# Patient Record
Sex: Female | Born: 1945 | Race: White | Hispanic: Refuse to answer | Marital: Married | Smoking: Former smoker
Health system: Southern US, Community
[De-identification: ages and names within clinical notes are randomized; demographics above are authoritative.]

## PROBLEM LIST (undated history)

## (undated) DIAGNOSIS — J984 Other disorders of lung: Secondary | ICD-10-CM

## (undated) DIAGNOSIS — I422 Other hypertrophic cardiomyopathy: Secondary | ICD-10-CM

## (undated) DIAGNOSIS — E785 Hyperlipidemia, unspecified: Secondary | ICD-10-CM

## (undated) DIAGNOSIS — I10 Essential (primary) hypertension: Secondary | ICD-10-CM

## (undated) DIAGNOSIS — R0609 Other forms of dyspnea: Secondary | ICD-10-CM

## (undated) DIAGNOSIS — I517 Cardiomegaly: Secondary | ICD-10-CM

## (undated) HISTORY — DX: Cardiomegaly: I51.7

## (undated) HISTORY — DX: Essential (primary) hypertension: I10

## (undated) HISTORY — DX: Hyperlipidemia, unspecified: E78.5

## (undated) HISTORY — DX: Other disorders of lung: J98.4

## (undated) HISTORY — DX: Other hypertrophic cardiomyopathy: I42.2

## (undated) HISTORY — DX: Other forms of dyspnea: R06.09

---

## 2000-05-13 HISTORY — PX: CARDIAC CATHETERIZATION: SHX172

## 2011-02-19 DIAGNOSIS — R0600 Dyspnea, unspecified: Secondary | ICD-10-CM

## 2011-02-19 DIAGNOSIS — R0609 Other forms of dyspnea: Secondary | ICD-10-CM

## 2011-02-19 HISTORY — DX: Dyspnea, unspecified: R06.00

## 2011-02-19 HISTORY — DX: Other forms of dyspnea: R06.09

## 2012-12-25 ENCOUNTER — Encounter: Payer: Self-pay | Admitting: Cardiovascular Disease

## 2012-12-28 ENCOUNTER — Ambulatory Visit (INDEPENDENT_AMBULATORY_CARE_PROVIDER_SITE_OTHER): Payer: 59 | Admitting: Cardiovascular Disease

## 2012-12-28 ENCOUNTER — Encounter: Payer: Self-pay | Admitting: Cardiovascular Disease

## 2012-12-28 VITALS — BP 130/80 | HR 66 | Ht 65.0 in | Wt 167.0 lb

## 2012-12-28 DIAGNOSIS — E785 Hyperlipidemia, unspecified: Secondary | ICD-10-CM

## 2012-12-28 DIAGNOSIS — I10 Essential (primary) hypertension: Secondary | ICD-10-CM

## 2012-12-28 DIAGNOSIS — R0609 Other forms of dyspnea: Secondary | ICD-10-CM | POA: Insufficient documentation

## 2012-12-28 DIAGNOSIS — Z87891 Personal history of nicotine dependence: Secondary | ICD-10-CM | POA: Insufficient documentation

## 2012-12-28 NOTE — Progress Notes (Signed)
12/28/2012 Yolanda Smith   [birth year]  [id number]  Primary Physician Leanor Rubenstein, MD Primary Cardiologist: Runell Gess MD Roseanne Reno   HPI:  The patient is a 67 year old, mildly overweight, married Caucasian female, mother of 2, grandmother to 3 grandchildren whose husband is also a patient of mine. She was self-referred for a second opinion because recent cardiovascular evaluation performed revealed an abnormal 2D echocardiogram.   Risk factors include 50-pack-year history of tobacco abuse, having quit 8 months ago. She has treated hypertension and dyslipidemia. There is no family history. She has never had a heart attack or stroke. She does complain of dyspnea but denies chest pain. She was apparently catheterized 10 years ago in [facility] and was told she had "normal coronary arteries." Echo performed at [facility] suggested right ventricular hypertrophy with pulmonary hypertension and moderate to severe left ventricular hypertrophy. A 2D echo repeated here showed normal LV size and function with mild asymmetric left ventricular hypertrophy, her RV was normal in size and function. 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  Since I saw her back 18 months ago she denies chest pain but does get occasional dyspnea on exertion when walking up stairs.     Current Outpatient Prescriptions  Medication Sig Dispense Refill  . aspirin 81 MG tablet Take 81 mg by mouth daily.      Marland Kitchen CITALOPRAM HYDROBROMIDE PO Take 40 mg by mouth daily.       Marland Kitchen estradiol (ESTRING) 2 MG vaginal ring Place 2 mg vaginally daily. follow package directions      . MEDROXYPROGESTERONE ACETATE PO Take 25 mg by mouth. Take one a day for 12 days every 3 months      . metoCLOPramide (REGLAN) 5 MG tablet       . nicotine polacrilex (NICORETTE) 2 MG lozenge Place 2 mg inside cheek as needed for smoking cessation.      . pravastatin (PRAVACHOL) 20 MG tablet  Take 20 mg by mouth daily.      . promethazine (PHENERGAN) 12.5 MG tablet       . SUPREP BOWEL PREP SOLN       . traZODone (DESYREL) 150 MG tablet Take 150 mg by mouth at bedtime. Take 1/2 tablet at bedtime      . verapamil (VERELAN PM) 240 MG 24 hr capsule Take 240 mg by mouth at bedtime.       No current facility-administered medications for this visit.    Allergies  Allergen Reactions  . Codeine     History   Social History  . Marital Status: Married    Spouse Name: N/A    Number of Children: 2  . Years of Education: N/A   Occupational History  . Office    Social History Main Topics  . Smoking status: Former Smoker -- 50 years    Quit date: 08/31/2010  . Smokeless tobacco: Not on file  . Alcohol Use: Yes     Comment: 6-10 drinks (beer or wine) a year  . Drug Use: No  . Sexual Activity: Not on file   Other Topics Concern  . Not on file   Social History Narrative  . No narrative on file     Review of Systems: General: negative for chills, fever, night sweats or weight changes.  Cardiovascular: negative for chest pain, dyspnea on exertion, edema, orthopnea, palpitations, paroxysmal nocturnal dyspnea or shortness of breath Dermatological: negative for rash Respiratory: negative  for cough or wheezing Urologic: negative for hematuria Abdominal: negative for nausea, vomiting, diarrhea, bright red blood per rectum, melena, or hematemesis Neurologic: negative for visual changes, syncope, or dizziness All other systems reviewed and are otherwise negative except as noted above.    Blood pressure 130/80, pulse 66, height 5\' 5"  (1.651 m), weight 167 lb (75.751 kg).  General appearance: alert and no distress Neck: no adenopathy, no carotid bruit, no JVD, supple, symmetrical, trachea midline and thyroid not enlarged, symmetric, no tenderness/mass/nodules Lungs: clear to auscultation bilaterally Heart: regular rate and rhythm, S1, S2 normal, no murmur, click, rub or  gallop Extremities: extremities normal, atraumatic, no cyanosis or edema  EKG normal sinus rhythm at 66 without ST or T wave changes  ASSESSMENT AND PLAN:   Essential hypertension Well-controlled on current medications  Hyperlipidemia On statin therapy followed by her primary care physician  Dyspnea on exertion Long history of tobacco abuse having stopped 2 years ago. She has an echo that shows normal LV systolic function with proximal septal thickening and mild to moderate TR. She has a history of normal coronary arteries by cath 12 years ago in [place]. I did do a MET test on her which I'll review      Runell Gess MD [facility], [place] 12/28/2012 11:33 AM

## 2012-12-28 NOTE — Assessment & Plan Note (Signed)
On statin therapy followed by her primary care physician 

## 2012-12-28 NOTE — Patient Instructions (Addendum)
Your physician wants you to follow-up in: 1 year with Dr Berry. You will receive a reminder letter in the mail two months in advance. If you don't receive a letter, please call our office to schedule the follow-up appointment.  

## 2012-12-28 NOTE — Assessment & Plan Note (Signed)
Long history of tobacco abuse having stopped 2 years ago. She has an echo that shows normal LV systolic function with proximal septal thickening and mild to moderate TR. She has a history of normal coronary arteries by cath 12 years ago in [place]. I did do a MET test on her which I'll review

## 2012-12-28 NOTE — Assessment & Plan Note (Signed)
Well-controlled on current medications 

## 2013-01-04 ENCOUNTER — Telehealth: Payer: Self-pay | Admitting: Cardiovascular Disease

## 2013-01-04 NOTE — Telephone Encounter (Signed)
I discussed with Ms. Yolanda Smith that her an MET test was mildly abnormal though her VO2was acceptable.I told her that I would see her back at our routine schedule time, sooner if she developed chest pain or increasing shortness of breath. She verbalized understanding.

## 2013-04-26 ENCOUNTER — Telehealth: Payer: Self-pay | Admitting: Cardiovascular Disease

## 2013-04-26 ENCOUNTER — Encounter (INDEPENDENT_AMBULATORY_CARE_PROVIDER_SITE_OTHER): Payer: Self-pay

## 2013-04-26 ENCOUNTER — Encounter: Payer: Self-pay | Admitting: *Deleted

## 2013-04-26 NOTE — Telephone Encounter (Signed)
Need  A new code for my chart,she was instructed to call herr doctor office.

## 2013-04-26 NOTE — Telephone Encounter (Signed)
Returned call and pt verified x 2.  Pt informed message received and letter w/ new code will be mailed.  Pt verbalized understanding and agreed w/ plan.

## 2013-04-26 NOTE — Telephone Encounter (Signed)
Wrong pt

## 2014-03-07 ENCOUNTER — Encounter: Payer: Self-pay | Admitting: Cardiovascular Disease

## 2014-03-07 ENCOUNTER — Ambulatory Visit (INDEPENDENT_AMBULATORY_CARE_PROVIDER_SITE_OTHER): Payer: 59 | Admitting: Cardiovascular Disease

## 2014-03-07 VITALS — BP 138/82 | HR 68 | Ht 64.5 in | Wt 171.0 lb

## 2014-03-07 DIAGNOSIS — I10 Essential (primary) hypertension: Secondary | ICD-10-CM

## 2014-03-07 DIAGNOSIS — E785 Hyperlipidemia, unspecified: Secondary | ICD-10-CM

## 2014-03-07 DIAGNOSIS — R0609 Other forms of dyspnea: Secondary | ICD-10-CM

## 2014-03-07 NOTE — Assessment & Plan Note (Signed)
On statin therapy. Her most recent lipid profile performed 09/29/13 revealed a total cholesterol of 186, LDL of 103 and HDL of 70

## 2014-03-07 NOTE — Assessment & Plan Note (Signed)
Controlled on current medications 

## 2014-03-07 NOTE — Patient Instructions (Signed)
Your physician wants you to follow-up in: 1 year with Dr Berry. You will receive a reminder letter in the mail two months in advance. If you don't receive a letter, please call our office to schedule the follow-up appointment.  

## 2014-03-07 NOTE — Assessment & Plan Note (Signed)
Attributed to her COPD with 50 pack years of tobacco abuse having quit 3 years ago

## 2014-03-07 NOTE — Progress Notes (Signed)
03/07/2014 Yolanda Smith   10/16/1945  [id number]  Primary Physician Leanor RubensteinSUN,VYVYAN Y, MD Primary Cardiologist: Runell GessJonathan J. Berry MD Roseanne RenoFACP,FACC,FAHA, FSCAI   HPI:  The patient is a 68 year old, mildly overweight, married Caucasian female, mother of 2, grandmother to 3 grandchildren whose husband is also a patient of mine. She was self-referred for a second opinion because recent cardiovascular evaluation performed revealed an abnormal 2D echocardiogram. I saw her one year ago  Risk factors include 50-pack-year history of tobacco abuse, having quit in 2012 She has treated hypertension and dyslipidemia. There is no family history. She has never had a heart attack or stroke. She does complain of dyspnea but denies chest pain. She was apparently catheter[facility] ago [facility] and was told she had "normal coronary arteries." Echo performed at [facility] suggested right ventricular hypertrophy with pulmonary hypertension and moderate to severe left ventricular hypertrophy. A 2D echo repeated here showed normal LV size and function with mild asymmetric left ventricular hypertrophy, her RV was normal in size and function. 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  Since I saw her back 14 months ago she denies chest pain but does get occasional dyspnea on exertion when walking up stairs.    Current Outpatient Prescriptions  Medication Sig Dispense Refill  . aspirin 81 MG tablet Take 81 mg by mouth daily.      Marland Kitchen. CITALOPRAM HYDROBROMIDE PO Take 40 mg by mouth daily.       Marland Kitchen. estradiol (ESTRING) 2 MG vaginal ring Place 2 mg vaginally daily. follow package directions      . MEDROXYPROGESTERONE ACETATE PO Take 25 mg by mouth. Take one a day for 12 days every 3 months      . nicotine polacrilex (NICORETTE) 2 MG lozenge Place 2 mg inside cheek as needed for smoking cessation.      . pravastatin (PRAVACHOL) 20 MG tablet Take 20 mg by mouth daily.       . traZODone (DESYREL) 150 MG tablet Take 150 mg by mouth at bedtime. Take 1/2 tablet at bedtime      . verapamil (VERELAN PM) 240 MG 24 hr capsule Take 240 mg by mouth at bedtime.       No current facility-administered medications for this visit.    Allergies  Allergen Reactions  . Codeine     History   Social History  . Marital Status: Married    Spouse Name: N/A    Number of Children: 2  . Years of Education: N/A   Occupational History  . Office    Social History Main Topics  . Smoking status: Former Smoker -- 50 years    Quit date: 08/31/2010  . Smokeless tobacco: Not on file  . Alcohol Use: Yes     Comment: 6-10 drinks (beer or wine) a year  . Drug Use: No  . Sexual Activity: Not on file   Other Topics Concern  . Not on file   Social History Narrative  . No narrative on file     Review of Systems: General: negative for chills, fever, night sweats or weight changes.  Cardiovascular: negative for chest pain, dyspnea on exertion, edema, orthopnea, palpitations, paroxysmal nocturnal dyspnea or shortness of breath Dermatological: negative for rash Respiratory: negative for cough or wheezing Urologic: negative for hematuria Abdominal: negative for nausea, vomiting, diarrhea, bright red blood per rectum, melena, or hematemesis Neurologic: negative for visual changes, syncope, or dizziness  All other systems reviewed and are otherwise negative except as noted above.    Blood pressure 138/82, pulse 68, height 5' 4.5" (1.638 m), weight 171 lb (77.565 kg).  General appearance: alert and no distress Neck: no adenopathy, no carotid bruit, no JVD, supple, symmetrical, trachea midline and thyroid not enlarged, symmetric, no tenderness/mass/nodules Lungs: clear to auscultation bilaterally Heart: regular rate and rhythm, S1, S2 normal, no murmur, click, rub or gallop Extremities: extremities normal, atraumatic, no cyanosis or edema  EKG sinus rhythm at 68 without ST  or T-wave changes  ASSESSMENT AND PLAN:   Essential hypertension Controlled on current medications  Hyperlipidemia On statin therapy. Her most recent lipid profile performed 09/29/13 revealed a total cholesterol of 186, LDL of 103 and HDL of 70  Dyspnea on exertion Attributed to her COPD with 50 pack years of tobacco abuse having quit 3 years ago      Runell GessJonathan J. Berry MD [facility] CenterFACP,FACC,FAHA, [facility] ServicesFSCAI 03/07/2014 5:13 PM

## 2014-11-08 ENCOUNTER — Encounter: Payer: Self-pay | Admitting: *Deleted

## 2019-06-24 ENCOUNTER — Encounter: Payer: Self-pay | Admitting: Nurse Practitioner

## 2019-07-01 ENCOUNTER — Ambulatory Visit: Payer: Self-pay | Admitting: Nurse Practitioner

## 2021-02-28 ENCOUNTER — Telehealth: Payer: Self-pay

## 2021-02-28 NOTE — Telephone Encounter (Signed)
NOTES SCANNED TO REFERRAL 

## 2021-04-10 ENCOUNTER — Encounter: Payer: Self-pay | Admitting: Cardiology

## 2021-04-10 ENCOUNTER — Ambulatory Visit (INDEPENDENT_AMBULATORY_CARE_PROVIDER_SITE_OTHER): Payer: 59 | Admitting: Cardiology

## 2021-04-10 ENCOUNTER — Other Ambulatory Visit: Payer: Self-pay

## 2021-04-10 VITALS — BP 148/78 | HR 60 | Ht 61.0 in | Wt 153.0 lb

## 2021-04-10 DIAGNOSIS — I480 Paroxysmal atrial fibrillation: Secondary | ICD-10-CM | POA: Diagnosis not present

## 2021-04-10 NOTE — Progress Notes (Signed)
Electrophysiology Office Note   Date:  04/10/2021   ID:  Yolanda Smith, DOB 01/12/1946, MRN [id number]  PCP:  Deatra James, MD  Cardiologist:  Elise Benne Primary Electrophysiologist:  Caprice Wasko Jorja Loa, MD    Chief Complaint: AF   History of Present Illness: Yolanda Smith is a 75 y.o. female who is being seen today for the evaluation of AF at the request of Coralee Rud, PA-C. Presenting today for electrophysiology evaluation.  She has a history significant for hypertension, hyperlipidemia, LVH with asymmetric septal hypertrophy.  She also has a history of atrial fibrillation.  She was initially on verapamil, though this was stopped due to junctional bradycardia.  She wore a recent cardiac monitor that showed episodes of atrial fibrillation.  She feels tired and fatigued when she is in atrial fibrillation.  She checks her pulse and notes that her heart rates are both fast and slow.  She would like to do something to stay in normal rhythm.  Today, she denies symptoms of palpitations, chest pain, shortness of breath, orthopnea, PND, lower extremity edema, claudication, dizziness, presyncope, syncope, bleeding, or neurologic sequela. The patient is tolerating medications without difficulties.    Past Medical History:  Diagnosis Date   Asymmetric septal hypertrophy (HCC)    Dyspnea on exertion 02/19/11   Transthoracic echocardiogram EF = >55%. Mild proximal septal thickening noted. LV systolic function is normal. Mild to moderate tricuspid regurgitation. RV systolic pressure is elevated at 32 mmHg. Aortic valve is mildly sclerotic. Mild aortic root dilatation.   Hyperlipidemia    Hypertension    Lung disease    mild   LVH (left ventricular hypertrophy)    Past Surgical History:  Procedure Laterality Date   CARDIAC CATHETERIZATION  2002   (From Dr. Hazle Coca office note 05/01/11) Pt. catheterized 10 years ago in [facility] and was told she had "normal coronary arteries"     Current  Outpatient Medications  Medication Sig Dispense Refill   apixaban (ELIQUIS) 5 MG TABS tablet Take 1 tablet by mouth daily.     CITALOPRAM HYDROBROMIDE PO Take 40 mg by mouth daily.      losartan (COZAAR) 50 MG tablet Take 50 mg by mouth daily.     nicotine polacrilex (COMMIT) 2 MG lozenge Place 2 mg inside cheek as needed for smoking cessation.     pravastatin (PRAVACHOL) 20 MG tablet Take 20 mg by mouth daily.     traZODone (DESYREL) 150 MG tablet Take 150 mg by mouth at bedtime. Take 1/2 tablet at bedtime     No current facility-administered medications for this visit.    Allergies:   Codeine   Social History:  The patient  reports that she quit smoking about 10 years ago. Her smoking use included cigarettes. She does not have any smokeless tobacco history on file. She reports current alcohol use. She reports that she does not use drugs.   Family History:  The patient's family history includes Breast cancer in her mother; Cancer in her paternal grandmother; Hodgkin's lymphoma in her father.    ROS:  Please see the history of present illness.   Otherwise, review of systems is positive for none.   All other systems are reviewed and negative.    PHYSICAL EXAM: VS:  BP (!) 148/78   Pulse 60   Ht 5\' 1"  (1.549 m)   Wt 153 lb (69.4 kg)   SpO2 94%   BMI 28.91 kg/m  , BMI Body mass index is 28.91 kg/m.  GEN: Well nourished, well developed, in no acute distress  HEENT: normal  Neck: no JVD, carotid bruits, or masses Cardiac: RRR; no murmurs, rubs, or gallops,no edema  Respiratory:  clear to auscultation bilaterally, normal work of breathing GI: soft, nontender, nondistended, + BS MS: no deformity or atrophy  Skin: warm and dry Neuro:  Strength and sensation are intact Psych: euthymic mood, full affect  EKG:  EKG is ordered today. Personal review of the ekg ordered shows sinus rhythm, rate 60  Recent Labs: No results found for requested labs within last 8760 hours.    Lipid  Panel  No results found for: CHOL, TRIG, HDL, CHOLHDL, VLDL, LDLCALC, LDLDIRECT   Wt Readings from Last 3 Encounters:  04/10/21 153 lb (69.4 kg)  03/07/14 171 lb (77.6 kg)  12/28/12 167 lb (75.8 kg)      Other studies Reviewed: Additional studies/ records that were reviewed today include: TTE 02/12/2021 Review of the above records today demonstrates:  Ejection fraction 60 to 65% LV size and wall thickness normal Mildly dilated left atrium Right ventricle normal in size and function Right atrium mildly enlarged Aortic valve structurally normal without stenosis or regurgitation normal-appearing mitral valve with normal valve function  ASSESSMENT AND PLAN:  1.  Paroxysmal atrial fibrillation: She would benefit from a rhythm control strategy.  She is currently on Eliquis 5 mg twice daily.  CHA2DS2-VASc of 4.  I discussed with her further options including ablation versus medication management with either dofetilide or Multaq.  She would like to discuss this further with her husband.  2.  Hypertension: Blood pressure elevated today.  We Dorann Davidson allow her primary physician to make further adjustments to her medications.  Case discussed with referring provider  Current medicines are reviewed at length with the patient today.   The patient does not have concerns regarding her medicines.  The following changes were made today:  none  Labs/ tests ordered today include:  Orders Placed This Encounter  Procedures   EKG 12-Lead      Disposition:   FU with Chevonne Bostrom 3 months  Signed, Donovan Gatchel Jorja Loa, MD  04/10/2021 10:51 AM     [facility] [address] [place] [address] [phone] (office) [phone] (fax)

## 2021-04-10 NOTE — Patient Instructions (Signed)
Medication Instructions:  Your physician recommends that you continue on your current medications as directed. Please refer to the Current Medication list given to you today.  *If you need a refill on your cardiac medications before your next appointment, please call your pharmacy*   Lab Work: None ordered If you have labs (blood work) drawn today and your tests are completely normal, you will receive your results only by: MyChart Message (if you have MyChart) OR A paper copy in the mail If you have any lab test that is abnormal or we need to change your treatment, we will call you to review the results.   Testing/Procedures: None ordered   Follow-Up: At [facility], you and your health needs are our priority.  As part of our continuing mission to provide you with exceptional heart care, we have created designated Provider Care Teams.  These Care Teams include your primary Cardiologist (physician) and Advanced Practice Providers (APPs -  Physician Assistants and Nurse Practitioners) who all work together to provide you with the care you need, when you need it.  We recommend signing up for the patient portal called "MyChart".  Sign up information is provided on this After Visit Summary.  MyChart is used to connect with patients for Virtual Visits (Telemedicine).  Patients are able to view lab/test results, encounter notes, upcoming appointments, etc.  Non-urgent messages can be sent to your provider as well.   To learn more about what you can do with MyChart, go to [email or web address].    Your next appointment:   3 month(s)  The format for your next appointment:   In Person  Provider:   Loman Brooklyn, MD    Thank you for choosing [facility]!!   Dory Horn, RN [phone]   Other Instructions  Cardiac Ablation Cardiac ablation is a procedure to destroy (ablate) some heart tissue that is sending bad signals. These bad signals cause problems in heart  rhythm. The heart has many areas that make these signals. If there are problems in these areas, they can make the heart beat in a way that is not normal. Destroying some tissues can help make the heart rhythm normal. Tell your doctor about: Any allergies you have. All medicines you are taking. These include vitamins, herbs, eye drops, creams, and over-the-counter medicines. Any problems you or family members have had with medicines that make you fall asleep (anesthetics). Any blood disorders you have. Any surgeries you have had. Any medical conditions you have, such as kidney failure. Whether you are pregnant or may be pregnant. What are the risks? This is a safe procedure. But problems may occur, including: Infection. Bruising and bleeding. Bleeding into the chest. Stroke or blood clots. Damage to nearby areas of your body. Allergies to medicines or dyes. The need for a pacemaker if the normal system is damaged. Failure of the procedure to treat the problem. What happens before the procedure? Medicines Ask your doctor about: Changing or stopping your normal medicines. This is important. Taking aspirin and ibuprofen. Do not take these medicines unless your doctor tells you to take them. Taking other medicines, vitamins, herbs, and supplements. General instructions Follow instructions from your doctor about what you cannot eat or drink. Plan to have someone take you home from the hospital or clinic. If you will be going home right after the procedure, plan to have someone with you for 24 hours. Ask your doctor what steps will be taken to prevent infection. What happens during the procedure?  An IV tube will be put into one of your veins. You will be given a medicine to help you relax. The skin on your neck or groin will be numbed. A cut (incision) will be made in your neck or groin. A needle will be put through your cut and into a large vein. A tube (catheter) will be put into the  needle. The tube will be moved to your heart. Dye may be put through the tube. This helps your doctor see your heart. Small devices (electrodes) on the tube will send out signals. A type of energy will be used to destroy some heart tissue. The tube will be taken out. Pressure will be held on your cut. This helps stop bleeding. A bandage will be put over your cut. The exact procedure may vary among doctors and hospitals. What happens after the procedure? You will be watched until you leave the hospital or clinic. This includes checking your heart rate, breathing rate, oxygen, and blood pressure. Your cut will be watched for bleeding. You will need to lie still for a few hours. Do not drive for 24 hours or as long as your doctor tells you. Summary Cardiac ablation is a procedure to destroy some heart tissue. This is done to treat heart rhythm problems. Tell your doctor about any medical conditions you may have. Tell him or her about all medicines you are taking to treat them. This is a safe procedure. But problems may occur. These include infection, bruising, bleeding, and damage to nearby areas of your body. Follow what your doctor tells you about food and drink. You may also be told to change or stop some of your medicines. After the procedure, do not drive for 24 hours or as long as your doctor tells you. This information is not intended to replace advice given to you by your health care provider. Make sure you discuss any questions you have with your health care provider. Document Revised: 04/01/2019 Document Reviewed: 04/01/2019 Elsevier Patient Education  2022 [employer].

## 2021-07-27 ENCOUNTER — Encounter: Payer: Self-pay | Admitting: *Deleted

## 2021-07-27 ENCOUNTER — Encounter: Payer: Self-pay | Admitting: Cardiology

## 2021-07-27 ENCOUNTER — Ambulatory Visit (INDEPENDENT_AMBULATORY_CARE_PROVIDER_SITE_OTHER): Payer: 59 | Admitting: Cardiology

## 2021-07-27 ENCOUNTER — Other Ambulatory Visit: Payer: Self-pay

## 2021-07-27 VITALS — BP 150/90 | HR 74 | Ht 64.0 in | Wt 153.0 lb

## 2021-07-27 DIAGNOSIS — Z01812 Encounter for preprocedural laboratory examination: Secondary | ICD-10-CM

## 2021-07-27 DIAGNOSIS — I480 Paroxysmal atrial fibrillation: Secondary | ICD-10-CM

## 2021-07-27 MED ORDER — METOPROLOL TARTRATE 100 MG PO TABS: ORAL_TABLET | ORAL | 0 refills | Status: DC

## 2021-07-27 NOTE — Progress Notes (Signed)
? ?Electrophysiology Office Note ? ? ?Date:  07/27/2021  ? ?ID:  Yolanda Smith Patient, DOB May 23, 1945, MRN [id number] ? ?PCP:  Yolanda Prose, MD  ?Cardiologist:  Yolanda Smith ?Primary Electrophysiologist:  Yolanda Legere Meredith Leeds, MD   ? ?Chief Complaint: AF ?  ?History of Present Illness: ?Yolanda Smith is a 76 y.o. female who is being seen today for the evaluation of AF at the request of Yolanda Prose, MD. Presenting today for electrophysiology evaluation. ? ?She has a history significant for hypertension, hyperlipidemia, LVH with asymmetric septal hypertrophy.  She also has a history of atrial fibrillation.  She was initially on verapamil but this was stopped due to junctional bradycardia.  She has fatigue and shortness of breath associated with her atrial fibrillation.  She would like a rhythm control strategy. ? ?Today, denies symptoms of palpitations, chest pain, shortness of breath, orthopnea, PND, lower extremity edema, claudication, dizziness, presyncope, syncope, bleeding, or neurologic sequela. The patient is tolerating medications without difficulties.  Her atrial fibrillation was well controlled over the last few months, but has started to increase more. ? ? ?Past Medical History:  ?Diagnosis Date  ? Asymmetric septal hypertrophy (HCC)   ? Dyspnea on exertion 02/19/11  ? Transthoracic echocardiogram EF = >55%. Mild proximal septal thickening noted. LV systolic function is normal. Mild to moderate tricuspid regurgitation. RV systolic pressure is elevated at 32 mmHg. Aortic valve is mildly sclerotic. Mild aortic root dilatation.  ? Hyperlipidemia   ? Hypertension   ? Lung disease   ? mild  ? LVH (left ventricular hypertrophy)   ? ?Past Surgical History:  ?Procedure Laterality Date  ? CARDIAC CATHETERIZATION  2002  ? (From Dr. Kennon Smith office note 05/01/11) Pt. catheterized 10 years ago in [facility] and was told she had "normal coronary arteries"  ? ? ? ?Current Outpatient Medications  ?Medication Sig Dispense Refill  ? apixaban  (ELIQUIS) 5 MG TABS tablet Take 1 tablet by mouth 2 (two) times daily.    ? CITALOPRAM HYDROBROMIDE PO Take 40 mg by mouth daily.     ? losartan (COZAAR) 50 MG tablet Take 50 mg by mouth daily.    ? metoprolol tartrate (LOPRESSOR) 100 MG tablet Take 1 tablet (100 mg total) 2 hours prior to CT scan 1 tablet 0  ? nicotine polacrilex (COMMIT) 2 MG lozenge Place 2 mg inside cheek as needed for smoking cessation.    ? pravastatin (PRAVACHOL) 20 MG tablet Take 20 mg by mouth daily.    ? traMADol-acetaminophen (ULTRACET) 37.5-325 MG tablet Take 1 tablet by mouth 3 (three) times daily.    ? traZODone (DESYREL) 150 MG tablet Take 150 mg by mouth at bedtime.    ? ?No current facility-administered medications for this visit.  ? ? ?Allergies:   Codeine  ? ?Social History:  The patient  reports that she quit smoking about 10 years ago. Her smoking use included cigarettes. She does not have any smokeless tobacco history on file. She reports current alcohol use. She reports that she does not use drugs.  ? ?Family History:  The patient's family history includes Breast cancer in her mother; Cancer in her paternal grandmother; Hodgkin's lymphoma in her father.  ? ?ROS:  Please see the history of present illness.   Otherwise, review of systems is positive for none.   All other systems are reviewed and negative.  ? ?PHYSICAL EXAM: ?VS:  BP (!) 150/90   Pulse 74   Ht 5\' 4"  (1.626 m)   Wt 153 lb (  69.4 kg)   SpO2 96%   BMI 26.26 kg/m?  , BMI Body mass index is 26.26 kg/m?. ?GEN: Well nourished, well developed, in no acute distress  ?HEENT: normal  ?Neck: no JVD, carotid bruits, or masses ?Cardiac: RRR; no murmurs, rubs, or gallops,no edema  ?Respiratory:  clear to auscultation bilaterally, normal work of breathing ?GI: soft, nontender, nondistended, + BS ?MS: no deformity or atrophy  ?Skin: warm and dry ?Neuro:  Strength and sensation are intact ?Psych: euthymic mood, full affect ? ?EKG:  EKG is ordered today. ?Personal review of the  ekg ordered shows ectopic atrial rhythm, rate 74 ? ?Recent Labs: ?No results found for requested labs within last 8760 hours.  ? ? ?Lipid Panel  ?No results found for: CHOL, TRIG, HDL, CHOLHDL, VLDL, LDLCALC, LDLDIRECT ? ? ?Wt Readings from Last 3 Encounters:  ?07/27/21 153 lb (69.4 kg)  ?04/10/21 153 lb (69.4 kg)  ?03/07/14 171 lb (77.6 kg)  ?  ? ? ?Other studies Reviewed: ?Additional studies/ records that were reviewed today include: TTE 02/12/2021 ?Review of the above records today demonstrates:  ?Ejection fraction 60 to 65% ?LV size and wall thickness normal ?Mildly dilated left atrium ?Right ventricle normal in size and function ?Right atrium mildly enlarged ?Aortic valve structurally normal without stenosis or regurgitation normal-appearing mitral valve with normal valve function ? ?ASSESSMENT AND PLAN: ? ?1.  Paroxysmal atrial fibrillation: Currently on Eliquis 5 mg twice daily.  CHA2DS2-VASc of 4.  She would benefit from a rhythm control strategy.  She would prefer to avoid medications such as dofetilide or Multaq.  Due to that, we Leylanie Woodmansee plan for ablation. ? ?Risk, benefits, and alternatives to EP study and radiofrequency ablation for afib were also discussed in detail today. These risks include but are not limited to stroke, bleeding, vascular damage, tamponade, perforation, damage to the esophagus, lungs, and other structures, pulmonary vein stenosis, worsening renal function, and death. The patient understands these risk and wishes to proceed.  We Labria Wos therefore proceed with catheter ablation at the next available time.  Carto, ICE, anesthesia are requested for the procedure.  Kyler Germer also obtain CT PV protocol prior to the procedure to exclude LAA thrombus and further evaluate atrial anatomy.  ? ?1.  Paroxysmal atrial fibrillation: She would benefit from a rhythm control strategy.  She is currently on Eliquis 5 mg twice daily.  CHA2DS2-VASc of 4.  I discussed with her further options including ablation versus  medication management with either dofetilide or Multaq.  She would like to discuss this further with her husband. ? ?2.  Hypertension: Blood pressure elevated today.  We Walterine Amodei allow her primary physician to make further adjustments. ? ?Secondary hypercoagulable state: Currently on Eliquis for atrial fibrillation as above ? ?Current medicines are reviewed at length with the patient today.   ?The patient does not have concerns regarding her medicines.  The following changes were made today:  none ? ?Labs/ tests ordered today include:  ?Orders Placed This Encounter  ?Procedures  ? CT CARDIAC MORPH/PULM VEIN W/CM&W/O CA SCORE  ? Basic metabolic panel  ? CBC  ? EKG 12-Lead  ? ? ? ? ?Disposition:   FU with Taron Conrey 3 months ? ?Signed, ?Calloway Andrus Meredith Leeds, MD  ?07/27/2021 4:39 PM    ? ?[facility] ?[address] ?[address] ?[place] [address] ?[phone] (office) ?[phone] (fax) ? ?

## 2021-07-27 NOTE — Patient Instructions (Addendum)
Medication Instructions:  ?Your physician recommends that you continue on your current medications as directed. Please refer to the Current Medication list given to you today. ? ?*If you need a refill on your cardiac medications before your next appointment, please call your pharmacy* ? ? ?Lab Work: ?Pre procedure labs 09/17/2021:  BMP & CBC ? ?If you have labs (blood work) drawn today and your tests are completely normal, you will receive your results only by: ?MyChart Message (if you have MyChart) OR ?A paper copy in the mail ?If you have any lab test that is abnormal or we need to change your treatment, we will call you to review the results. ? ? ?Testing/Procedures: ?Your physician has requested that you have cardiac CT within 7 days PRIOR to your ablation. Cardiac computed tomography (CT) is a painless test that uses an x-ray machine to take clear, detailed pictures of your heart.  Please follow instruction sheet given to you today. ?You will get a call from our office to schedule the date for this test. ? ?Your physician has recommended that you have an ablation. Catheter ablation is a medical procedure used to treat some cardiac arrhythmias (irregular heartbeats). During catheter ablation, a long, thin, flexible tube is put into a blood vessel in your groin (upper thigh), or neck. This tube is called an ablation catheter. It is then guided to your heart through the blood vessel. Radio frequency waves destroy small areas of heart tissue where abnormal heartbeats may cause an arrhythmia to start. Please follow instruction sheet given to you today. ? ? ?Follow-Up: ?At [facility], you and your health needs are our priority.  As part of our continuing mission to provide you with exceptional heart care, we have created designated Provider Care Teams.  These Care Teams include your primary Cardiologist (physician) and Advanced Practice Providers (APPs -  Physician Assistants and Nurse Practitioners) who all work  together to provide you with the care you need, when you need it. ? ?Your next appointment:   ?1 month(s) after your ablation ? ?The format for your next appointment:   ?In Person ? ?Provider:   ?AFib clinic ? ? ?Thank you for choosing [facility]!! ? ? ?Dory Horn, RN ?[phone] ? ? ? ?Other Instructions ?                          ?Cardiac Ablation ?Cardiac ablation is a procedure to destroy (ablate) some heart tissue that is sending bad signals. These bad signals cause problems in heart rhythm. ?The heart has many areas that make these signals. If there are problems in these areas, they can make the heart beat in a way that is not normal. Destroying some tissues can help make the heart rhythm normal. ?Tell your doctor about: ?Any allergies you have. ?All medicines you are taking. These include vitamins, herbs, eye drops, creams, and over-the-counter medicines. ?Any problems you or family members have had with medicines that make you fall asleep (anesthetics). ?Any blood disorders you have. ?Any surgeries you have had. ?Any medical conditions you have, such as kidney failure. ?Whether you are pregnant or may be pregnant. ?What are the risks? ?This is a safe procedure. But problems may occur, including: ?Infection. ?Bruising and bleeding. ?Bleeding into the chest. ?Stroke or blood clots. ?Damage to nearby areas of your body. ?Allergies to medicines or dyes. ?The need for a pacemaker if the normal system is damaged. ?Failure of the procedure to  treat the problem. ?What happens before the procedure? ?Medicines ?Ask your doctor about: ?Changing or stopping your normal medicines. This is important. ?Taking aspirin and ibuprofen. Do not take these medicines unless your doctor tells you to take them. ?Taking other medicines, vitamins, herbs, and supplements. ?General instructions ?Follow instructions from your doctor about what you cannot eat or drink. ?Plan to have someone take you home from the hospital or  clinic. ?If you will be going home right after the procedure, plan to have someone with you for 24 hours. ?Ask your doctor what steps will be taken to prevent infection. ?What happens during the procedure? ? ?An IV tube will be put into one of your veins. ?You will be given a medicine to help you relax. ?The skin on your neck or groin will be numbed. ?A cut (incision) will be made in your neck or groin. A needle will be put through your cut and into a large vein. ?A tube (catheter) will be put into the needle. The tube will be moved to your heart. ?Dye may be put through the tube. This helps your doctor see your heart. ?Small devices (electrodes) on the tube will send out signals. ?A type of energy will be used to destroy some heart tissue. ?The tube will be taken out. ?Pressure will be held on your cut. This helps stop bleeding. ?A bandage will be put over your cut. ?The exact procedure may vary among doctors and hospitals. ?What happens after the procedure? ?You will be watched until you leave the hospital or clinic. This includes checking your heart rate, breathing rate, oxygen, and blood pressure. ?Your cut will be watched for bleeding. You will need to lie still for a few hours. ?Do not drive for 24 hours or as long as your doctor tells you. ?Summary ?Cardiac ablation is a procedure to destroy some heart tissue. This is done to treat heart rhythm problems. ?Tell your doctor about any medical conditions you may have. Tell him or her about all medicines you are taking to treat them. ?This is a safe procedure. But problems may occur. These include infection, bruising, bleeding, and damage to nearby areas of your body. ?Follow what your doctor tells you about food and drink. You may also be told to change or stop some of your medicines. ?After the procedure, do not drive for 24 hours or as long as your doctor tells you. ?This information is not intended to replace advice given to you by your health care provider.  Make sure you discuss any questions you have with your health care provider. ?Document Revised: 04/01/2019 Document Reviewed: 04/01/2019 ?Elsevier Patient Education ? 2022 Elsevier Inc[employer] ? ? ?

## 2021-09-17 ENCOUNTER — Other Ambulatory Visit: Payer: 59 | Admitting: *Deleted

## 2021-09-17 DIAGNOSIS — I480 Paroxysmal atrial fibrillation: Secondary | ICD-10-CM

## 2021-09-17 DIAGNOSIS — Z01812 Encounter for preprocedural laboratory examination: Secondary | ICD-10-CM

## 2021-09-17 LAB — BASIC METABOLIC PANEL
BUN/Creatinine Ratio: 8 — ABNORMAL LOW (ref 12–28)
BUN: 7 mg/dL — ABNORMAL LOW (ref 8–27)
CO2: 29 mmol/L (ref 20–29)
Calcium: 9.2 mg/dL (ref 8.7–10.3)
Chloride: 108 mmol/L — ABNORMAL HIGH (ref 96–106)
Creatinine, Ser: 0.84 mg/dL (ref 0.57–1.00)
Glucose: 96 mg/dL (ref 70–99)
Potassium: 3.9 mmol/L (ref 3.5–5.2)
Sodium: 144 mmol/L (ref 134–144)
eGFR: 72 mL/min/{1.73_m2} (ref >59)

## 2021-09-17 LAB — CBC
Hematocrit: 34 % (ref 34.0–46.6)
Hemoglobin: 11.4 g/dL (ref 11.1–15.9)
MCH: 30.2 pg (ref 26.6–33.0)
MCHC: 33.5 g/dL (ref 31.5–35.7)
MCV: 90 fL (ref 79–97)
Platelets: 246 10*3/uL (ref 150–450)
RBC: 3.77 x10E6/uL (ref 3.77–5.28)
RDW: 19.1 % — ABNORMAL HIGH (ref 11.7–15.4)
WBC: 6.1 10*3/uL (ref 3.4–10.8)

## 2021-09-25 ENCOUNTER — Telehealth (HOSPITAL_COMMUNITY): Payer: Self-pay | Admitting: Emergency Medicine

## 2021-09-25 NOTE — Progress Notes (Signed)
Pt has been made aware of normal result and verbalized understanding.  jw

## 2021-09-25 NOTE — Telephone Encounter (Signed)
Reaching out to patient to offer assistance regarding upcoming cardiac imaging study; pt verbalizes understanding of appt date/time, parking situation and where to check in, pre-test NPO status and medications ordered, and verified current allergies; name and call back number provided for further questions should they arise ?Rockwell Alexandria RN Navigator Cardiac Imaging ?[facility] ?[phone] office ?[phone] cell ? ?100mg  metoprolol tartrate  ?Arrival 100 ? ?

## 2021-09-26 ENCOUNTER — Ambulatory Visit (HOSPITAL_COMMUNITY)
Admission: RE | Admit: 2021-09-26 | Discharge: 2021-09-26 | Disposition: A | Discharge status: Home / Self Care | Payer: 59 | Source: Ambulatory Visit | Attending: Cardiology | Admitting: Cardiology

## 2021-09-26 DIAGNOSIS — I480 Paroxysmal atrial fibrillation: Secondary | ICD-10-CM

## 2021-09-26 IMAGING — CT CT HEART MORPH/PULM VEIN W/ CM & W/O CA SCORE
2 of 6 series · 12 of 20 positions shown, 14 images · IV contrast (Omni 300)
Comparison: None.
COMPARISON: None.

Addendum:
EXAM:
OVER-READ INTERPRETATION  CT CHEST

The following report is a limited chest CT over-read performed by
radiologist Dr. LAGO [REDACTED] on
[DATE]. This over-read does not include interpretation of cardiac
or coronary anatomy or pathology . The interpretation by the
cardiologist is attached.
CLINICAL DATA: Atrial fibrillation scheduled for an ablation.
Cardiac CT/CTA
TECHNIQUE: The patient was scanned on a Siemens Somatom scanner.

[Series 8: 0-90% · axial · 0.39mm/px · z∈[+1061,+1153]mm · 6 of 2590 slices shown, 8 images]
[im 370/2590  vessel]
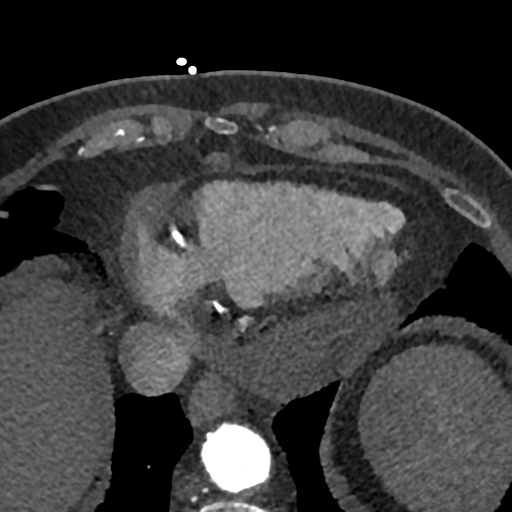
[im 370/2590  lung]
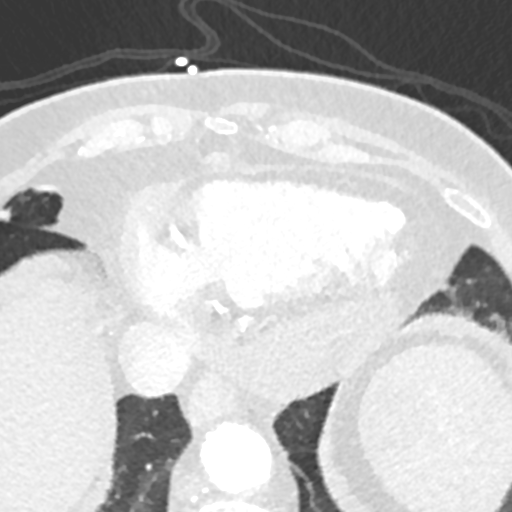
[im 740/2590  vessel]
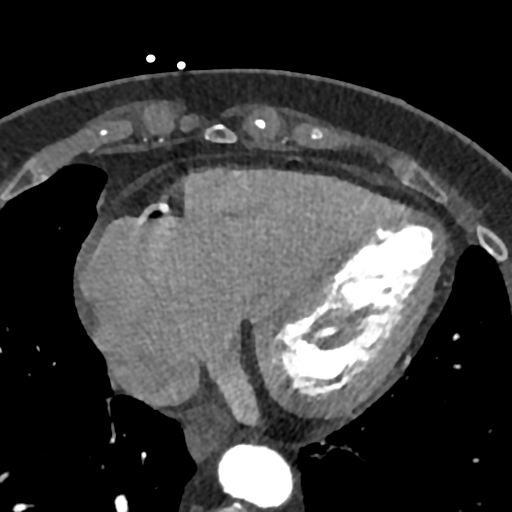
[im 1110/2590  vessel]
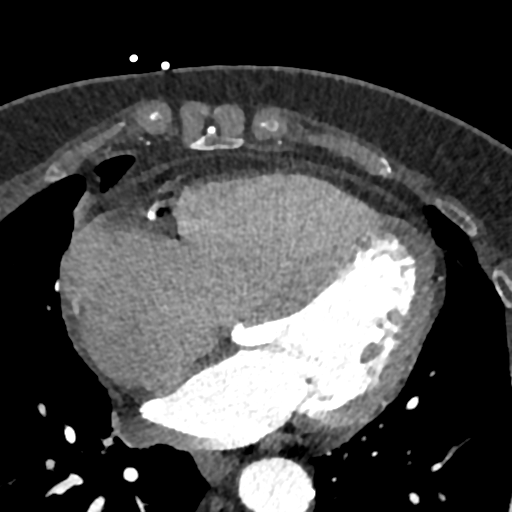
[im 1480/2590  vessel]
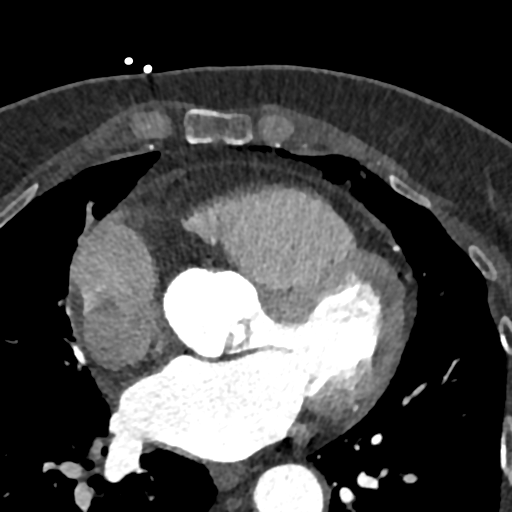
[im 1850/2590  vessel]
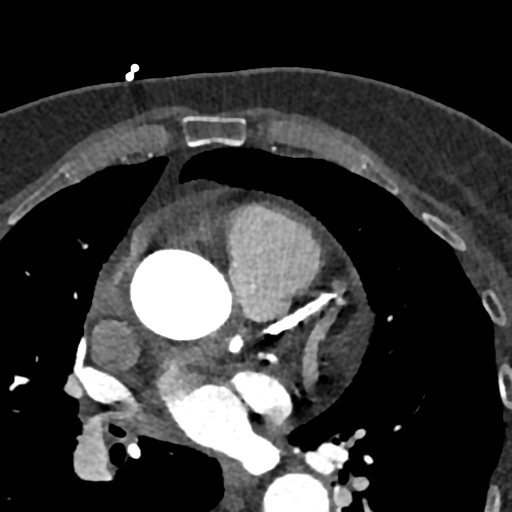
[im 1850/2590  lung]
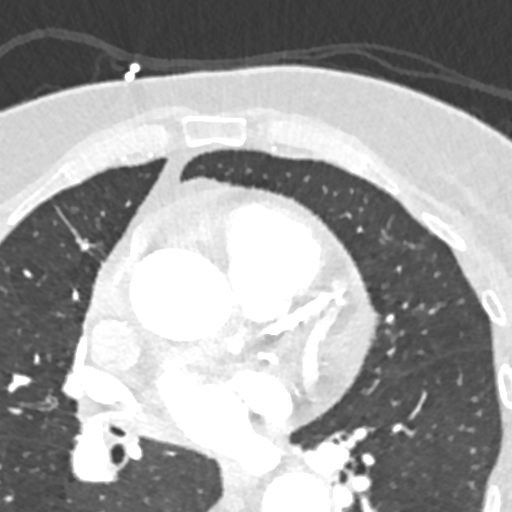
[im 2220/2590  vessel]
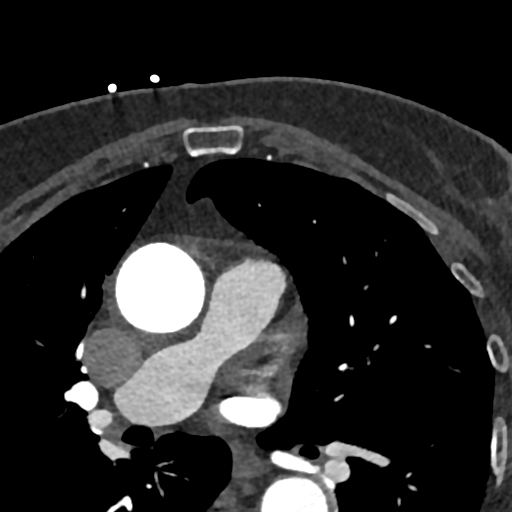

[Series 13: 5-95% · axial · 0.79mm/px · z∈[+1061,+1153]mm · 6 of 2590 slices shown]
[im 370/2590  vessel]
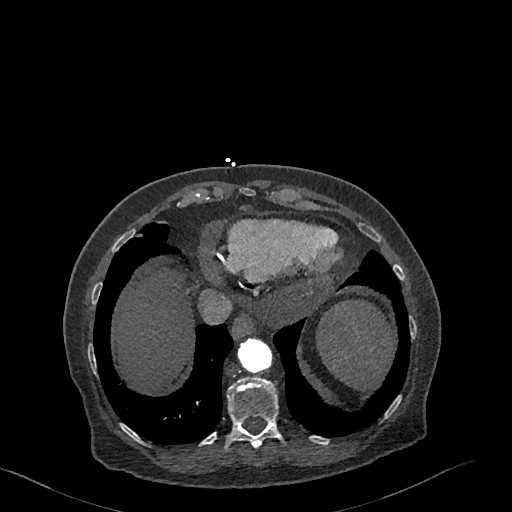
[im 740/2590  vessel]
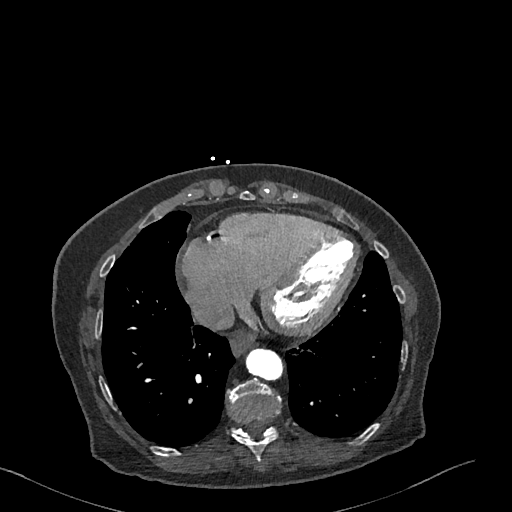
[im 1110/2590  vessel]
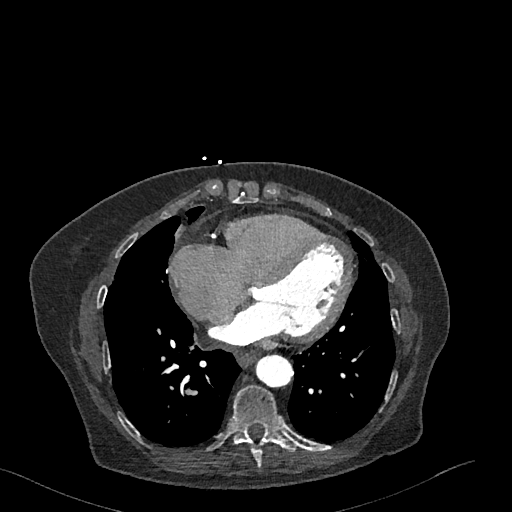
[im 1480/2590  vessel]
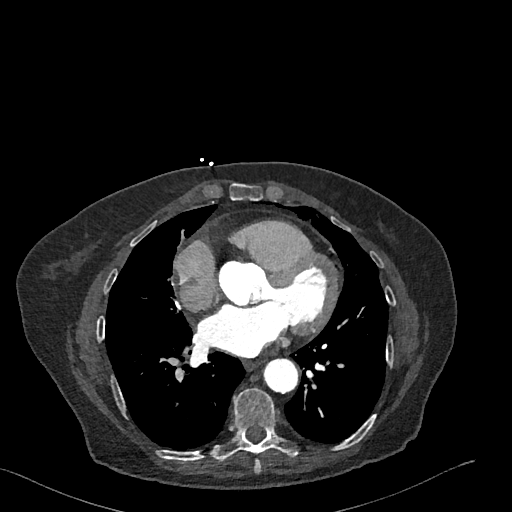
[im 1850/2590  vessel]
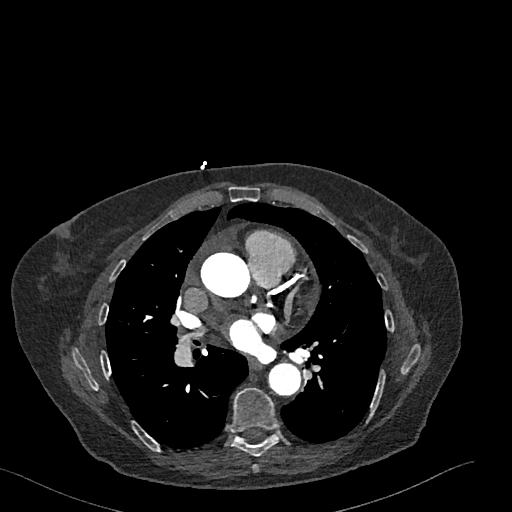
[im 2220/2590  vessel]
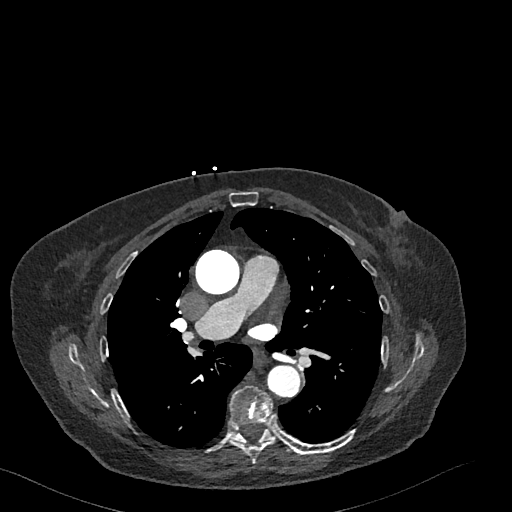

[12 of 20 positions shown; findings below may reference images not displayed]

FINDINGS: Vascular: Small pericardial effusion.

Mediastinum/Nodes: There may be distal esophageal wall thickening
which can be seen with gastroesophageal reflux.

Lungs/Pleura: Right middle lobe and lingular scarring. No suspicious
pulmonary nodules.

Upper Abdomen: None.

Musculoskeletal: Degenerative changes in the spine.
IMPRESSION: Small pericardial effusion.
FINDINGS: A 120 kV prospective scan was triggered in the descending thoracic
aorta at 111 HU's. Gantry rotation speed was 280 msecs and
collimation was .9 mm. No beta blockade and no NTG was given. The 3D
data set was reconstructed in 5% intervals of the 0-95 % of the R-R
cycle. Diastolic phases were analyzed on a dedicated work station
using MPR, MIP and VRT modes. The patient received 100mL OMNIPAQUE
IOHEXOL 350 MG/ML SOLN.

There is normal pulmonary vein drainage into the left atrium (2 on
the right and 2 on the left) with ostial measurements as follows:

RUPV: 17.7 x 15.0 mm, area 2.05 cm2

RLPV: 22.1 x 18.5 mm, area 3.13 cm2

LUPV: 17.3 x 10.8 mm, area 1.43 cm2

LLPV: 17.7 x 11.2 cm2, area 1.57 cm2

No anomalous pulmonary venous drainage. No pulmonary vein stenosis.

Normal left atrial appendage, no left atrial appendage thrombus. No
intracardiac mass or thrombus.

The esophagus is adjacent to the left lower pulmonary vein.

Atria: Moderate right atrial enlargement.

Aorta: Normal caliber, 35 mm mid ascending aorta. No dissection.
Mild calcifications.

Aortic Valve:  Trileaflet.  Mild calcifications.

Coronary Arteries: Normal coronary origin. Right dominance. The
study was performed without use of NTG and insufficient for plaque
evaluation. Coronary artery calcium score is 668, which is 89th
percentile for age and sex matched peers. Calcifications seen in the
distal left main coronary artery extending into the ostial and
proximal LAD and left circumflex artery, as well as the proximal
RCA.

Pulmonary artery: Normal caliber main pulmonary artery.

Pericardium: Small pericardial effusion.
IMPRESSION: 1. There is normal pulmonary vein drainage into the left atrium. No
pulmonary vein stenosis.

2. Normal left atrial appendage, no left atrial appendage thrombus.
No intracardiac mass or thrombus.

3. The esophagus is adjacent to the left lower pulmonary vein.

4. Coronary artery calcium score is 668, which is 89th percentile
for age and sex matched peers. Calcifications seen in the distal
left main coronary artery extending into the ostial and proximal LAD
and left circumflex artery, as well as the proximal RCA.

5.  Moderate right atrial enlargement by visual estimate.

*** End of Addendum ***
EXAM:
OVER-READ INTERPRETATION  CT CHEST

The following report is a limited chest CT over-read performed by
radiologist Dr. LAGO [REDACTED] on
[DATE]. This over-read does not include interpretation of cardiac
or coronary anatomy or pathology . The interpretation by the
cardiologist is attached.
FINDINGS: Vascular: Small pericardial effusion.

Mediastinum/Nodes: There may be distal esophageal wall thickening
which can be seen with gastroesophageal reflux.

Lungs/Pleura: Right middle lobe and lingular scarring. No suspicious
pulmonary nodules.

Upper Abdomen: None.

Musculoskeletal: Degenerative changes in the spine.
IMPRESSION: Small pericardial effusion.

## 2021-09-26 MED ORDER — IOHEXOL 350 MG/ML SOLN
100.0000 mL | Freq: Once | INTRAVENOUS | Status: AC | PRN
  Administered 2021-09-26: 100 mL via INTRAVENOUS

## 2021-09-26 MED ORDER — SODIUM CHLORIDE 0.9 % IV BOLUS
250.0000 mL | Freq: Once | INTRAVENOUS | Status: AC
  Administered 2021-09-26: 250 mL via INTRAVENOUS

## 2021-10-01 NOTE — Anesthesia Preprocedure Evaluation (Addendum)
Anesthesia Evaluation  Patient identified by MRN, date of birth, ID band Patient awake    Reviewed: Allergy & Precautions, NPO status , Patient's Chart, lab work & pertinent test results  Airway Mallampati: II  TM Distance: >3 FB Neck ROM: Full    Dental no notable dental hx.    Pulmonary neg pulmonary ROS, former smoker,    Pulmonary exam normal        Cardiovascular hypertension, Pt. on medications and Pt. on home beta blockers + dysrhythmias Atrial Fibrillation  Rhythm:Irregular Rate:Normal     Neuro/Psych negative neurological ROS  negative psych ROS   GI/Hepatic negative GI ROS, Neg liver ROS,   Endo/Other  negative endocrine ROS  Renal/GU negative Renal ROS  negative genitourinary   Musculoskeletal negative musculoskeletal ROS (+)   Abdominal Normal abdominal exam  (+)   Peds  Hematology negative hematology ROS (+)   Anesthesia Other Findings   Reproductive/Obstetrics                            Anesthesia Physical Anesthesia Plan  ASA: 3  Anesthesia Plan: General   Post-op Pain Management:    Induction: Intravenous  PONV Risk Score and Plan: 3 and Ondansetron, Dexamethasone and Treatment may vary due to age or medical condition  Airway Management Planned: Mask and Oral ETT  Additional Equipment: None  Intra-op Plan:   Post-operative Plan: Extubation in OR  Informed Consent: I have reviewed the patients History and Physical, chart, labs and discussed the procedure including the risks, benefits and alternatives for the proposed anesthesia with the patient or authorized representative who has indicated his/her understanding and acceptance.     Dental advisory given  Plan Discussed with: CRNA  Anesthesia Plan Comments: (Lab Results      Component                Value               Date                      WBC                      6.1                 09/17/2021                 HGB                      11.4                09/17/2021                HCT                      34.0                09/17/2021                MCV                      90                  09/17/2021                PLT  246                 09/17/2021           Lab Results      Component                Value               Date                      NA                       144                 09/17/2021                K                        3.9                 09/17/2021                CO2                      29                  09/17/2021                GLUCOSE                  96                  09/17/2021                BUN                      7 (L)               09/17/2021                CREATININE               0.84                09/17/2021                CALCIUM                  9.2                 09/17/2021                EGFR                     72                  09/17/2021          )       Anesthesia Quick Evaluation

## 2021-10-01 NOTE — Pre-Procedure Instructions (Signed)
Instructed patient on the following items: Arrival time 0530 Nothing to eat or drink after midnight No meds AM of procedure Responsible person to drive you home and stay with you for 24 hrs  Have you missed any doses of anti-coagulant Eliquis-missed this am's doses, she will take this PM doses, notified Dr Elberta Fortis, will obtain EKG on arrival.

## 2021-10-02 ENCOUNTER — Encounter (HOSPITAL_COMMUNITY): Payer: Self-pay | Admitting: Cardiology

## 2021-10-02 ENCOUNTER — Other Ambulatory Visit: Payer: Self-pay

## 2021-10-02 ENCOUNTER — Encounter (HOSPITAL_COMMUNITY)
Admission: RE | Disposition: A | Discharge status: Home / Self Care | Payer: Self-pay | Source: Home / Self Care | Attending: Cardiology

## 2021-10-02 ENCOUNTER — Ambulatory Visit (HOSPITAL_COMMUNITY)
Admission: RE | Admit: 2021-10-02 | Discharge: 2021-10-02 | Disposition: A | Discharge status: Home / Self Care | Payer: 59 | Attending: Cardiology | Admitting: Cardiology

## 2021-10-02 ENCOUNTER — Ambulatory Visit (HOSPITAL_COMMUNITY): Payer: 59 | Admitting: Anesthesiology

## 2021-10-02 ENCOUNTER — Ambulatory Visit (HOSPITAL_BASED_OUTPATIENT_CLINIC_OR_DEPARTMENT_OTHER): Payer: 59 | Admitting: Anesthesiology

## 2021-10-02 DIAGNOSIS — I4811 Longstanding persistent atrial fibrillation: Secondary | ICD-10-CM

## 2021-10-02 DIAGNOSIS — I480 Paroxysmal atrial fibrillation: Secondary | ICD-10-CM | POA: Insufficient documentation

## 2021-10-02 DIAGNOSIS — I10 Essential (primary) hypertension: Secondary | ICD-10-CM | POA: Diagnosis not present

## 2021-10-02 DIAGNOSIS — E785 Hyperlipidemia, unspecified: Secondary | ICD-10-CM | POA: Diagnosis not present

## 2021-10-02 DIAGNOSIS — I4891 Unspecified atrial fibrillation: Secondary | ICD-10-CM | POA: Diagnosis not present

## 2021-10-02 DIAGNOSIS — Z87891 Personal history of nicotine dependence: Secondary | ICD-10-CM | POA: Insufficient documentation

## 2021-10-02 HISTORY — PX: ATRIAL FIBRILLATION ABLATION: EP1191

## 2021-10-02 SURGERY — ATRIAL FIBRILLATION ABLATION
Anesthesia: General

## 2021-10-02 MED ORDER — PROPOFOL 10 MG/ML IV BOLUS
INTRAVENOUS | Status: DC | PRN
  Administered 2021-10-02: 120 mg via INTRAVENOUS

## 2021-10-02 MED ORDER — HEPARIN SODIUM (PORCINE) 1000 UNIT/ML IJ SOLN
INTRAMUSCULAR | Status: DC | PRN
  Administered 2021-10-02: 1000 [IU] via INTRAVENOUS
  Administered 2021-10-02: 14000 [IU] via INTRAVENOUS
  Administered 2021-10-02: 5000 [IU] via INTRAVENOUS

## 2021-10-02 MED ORDER — PROTAMINE SULFATE 10 MG/ML IV SOLN
INTRAVENOUS | Status: DC | PRN
  Administered 2021-10-02: 40 mg via INTRAVENOUS

## 2021-10-02 MED ORDER — APIXABAN 5 MG PO TABS
5.0000 mg | ORAL_TABLET | Freq: Once | ORAL | Status: AC
Start: 2021-10-02 — End: 2021-10-02
  Administered 2021-10-02: 5 mg via ORAL
  Filled 2021-10-02 (×2): qty 1

## 2021-10-02 MED ORDER — PHENYLEPHRINE HCL-NACL 20-0.9 MG/250ML-% IV SOLN: INTRAVENOUS | Status: DC | PRN

## 2021-10-02 MED ORDER — ONDANSETRON HCL 4 MG/2ML IJ SOLN
INTRAMUSCULAR | Status: DC | PRN
  Administered 2021-10-02: 4 mg via INTRAVENOUS

## 2021-10-02 MED ORDER — ONDANSETRON HCL 4 MG/2ML IJ SOLN: 4.0000 mg | Freq: Four times a day (QID) | INTRAMUSCULAR | Status: DC | PRN

## 2021-10-02 MED ORDER — FENTANYL CITRATE (PF) 100 MCG/2ML IJ SOLN
INTRAMUSCULAR | Status: DC | PRN
  Administered 2021-10-02: 100 ug via INTRAVENOUS

## 2021-10-02 MED ORDER — HEPARIN (PORCINE) IN NACL 1000-0.9 UT/500ML-% IV SOLN
INTRAVENOUS | Status: DC | PRN
  Administered 2021-10-02 (×5): 500 mL

## 2021-10-02 MED ORDER — DOBUTAMINE INFUSION FOR EP/ECHO/NUC (1000 MCG/ML)
INTRAVENOUS | Status: DC | PRN
  Administered 2021-10-02: 10 ug/kg/min via INTRAVENOUS

## 2021-10-02 MED ORDER — ACETAMINOPHEN 325 MG PO TABS
650.0000 mg | ORAL_TABLET | ORAL | Status: DC | PRN
  Administered 2021-10-02: 650 mg via ORAL
  Filled 2021-10-02 (×2): qty 2

## 2021-10-02 MED ORDER — HEPARIN SODIUM (PORCINE) 1000 UNIT/ML IJ SOLN
INTRAMUSCULAR | Status: DC | PRN
  Administered 2021-10-02: 1000 [IU] via INTRAVENOUS

## 2021-10-02 MED ORDER — LIDOCAINE-EPINEPHRINE 1 %-1:100000 IJ SOLN
INTRAMUSCULAR | Status: DC
Start: 2021-10-02 — End: 2021-10-02
  Filled 2021-10-02: qty 1

## 2021-10-02 MED ORDER — SODIUM CHLORIDE 0.9 % IV SOLN: INTRAVENOUS | Status: DC

## 2021-10-02 MED ORDER — HEPARIN (PORCINE) IN NACL 1000-0.9 UT/500ML-% IV SOLN
INTRAVENOUS | Status: AC
  Filled 2021-10-02: qty 2500

## 2021-10-02 MED ORDER — ROCURONIUM BROMIDE 10 MG/ML (PF) SYRINGE
PREFILLED_SYRINGE | INTRAVENOUS | Status: DC | PRN
  Administered 2021-10-02: 50 mg via INTRAVENOUS

## 2021-10-02 MED ORDER — LACTATED RINGERS IV SOLN: INTRAVENOUS | Status: DC | PRN

## 2021-10-02 MED ORDER — DEXAMETHASONE SODIUM PHOSPHATE 10 MG/ML IJ SOLN
INTRAMUSCULAR | Status: DC | PRN
  Administered 2021-10-02: 10 mg via INTRAVENOUS

## 2021-10-02 MED ORDER — DOBUTAMINE INFUSION FOR EP/ECHO/NUC (1000 MCG/ML)
INTRAVENOUS | Status: AC
Start: 2021-10-02 — End: ?
  Filled 2021-10-02: qty 250

## 2021-10-02 MED ORDER — FENTANYL CITRATE (PF) 250 MCG/5ML IJ SOLN: INTRAMUSCULAR | Status: DC | PRN

## 2021-10-02 MED ORDER — TRAMADOL HCL 50 MG PO TABS
50.0000 mg | ORAL_TABLET | Freq: Once | ORAL | Status: AC
Start: 2021-10-02 — End: 2021-10-02
  Administered 2021-10-02: 50 mg via ORAL
  Filled 2021-10-02: qty 1

## 2021-10-02 MED ORDER — LIDOCAINE 2% (20 MG/ML) 5 ML SYRINGE
INTRAMUSCULAR | Status: DC | PRN
  Administered 2021-10-02: 60 mg via INTRAVENOUS

## 2021-10-02 MED ORDER — PHENYLEPHRINE 80 MCG/ML (10ML) SYRINGE FOR IV PUSH (FOR BLOOD PRESSURE SUPPORT)
PREFILLED_SYRINGE | INTRAVENOUS | Status: DC | PRN
  Administered 2021-10-02 (×2): 80 ug via INTRAVENOUS

## 2021-10-02 MED ORDER — HEPARIN SODIUM (PORCINE) 1000 UNIT/ML IJ SOLN
INTRAMUSCULAR | Status: AC
  Filled 2021-10-02: qty 10

## 2021-10-02 MED ORDER — SUGAMMADEX SODIUM 200 MG/2ML IV SOLN
INTRAVENOUS | Status: DC | PRN
Start: 2021-10-02 — End: 2021-10-02
  Administered 2021-10-02: 200 mg via INTRAVENOUS

## 2021-10-02 SURGICAL SUPPLY — 18 items
CATH 8FR REPROCESSED SOUNDSTAR (CATHETERS) ×2 IMPLANT
CATH 8FR SOUNDSTAR REPROCESSED (CATHETERS) IMPLANT
CATH OCTARAY 2.0 F 3-3-3-3-3 (CATHETERS) ×2 IMPLANT
CATH S CIRCA THERM PROBE 10F (CATHETERS) ×1 IMPLANT
CATH SMTCH THERMOCOOL SF DF (CATHETERS) ×1 IMPLANT
CATH WEB BI DIR CSDF CRV REPRO (CATHETERS) ×1 IMPLANT
CLOSURE PERCLOSE PROSTYLE (VASCULAR PRODUCTS) ×4 IMPLANT
COVER SWIFTLINK CONNECTOR (BAG) ×2 IMPLANT
KIT VERSACROSS STEERABLE D1 (CATHETERS) ×1 IMPLANT
PACK EP LATEX FREE (CUSTOM PROCEDURE TRAY) ×2
PACK EP LF (CUSTOM PROCEDURE TRAY) ×1 IMPLANT
PAD DEFIB RADIO PHYSIO CONN (PAD) ×2 IMPLANT
PATCH CARTO3 (PAD) ×1 IMPLANT
SHEATH CARTO VIZIGO SM CVD (SHEATH) ×1 IMPLANT
SHEATH PINNACLE 7F 10CM (SHEATH) ×1 IMPLANT
SHEATH PINNACLE 8F 10CM (SHEATH) ×2 IMPLANT
SHEATH PINNACLE 9F 10CM (SHEATH) ×1 IMPLANT
TUBING SMART ABLATE COOLFLOW (TUBING) ×1 IMPLANT

## 2021-10-02 NOTE — H&P (Signed)
Electrophysiology Office Note   Date:  10/02/2021   ID:  Yolanda Smith, Yolanda Smith [birth year], MRN [id number]  PCP:  [facility]  Cardiologist:  Lennice Sites Primary Electrophysiologist:  Frandy Basnett Meredith Leeds, MD    Chief Complaint: AF   History of Present Illness: Yolanda Smith is a 76 y.o. female who is being seen today for the evaluation of AF at the request of No ref. provider found. Presenting today for electrophysiology evaluation.  She has a history significant for hypertension, hyperlipidemia, LVH with asymmetric septal hypertrophy.  She also has a history of atrial fibrillation.  She was initially on verapamil but this was stopped due to junctional bradycardia.  She has fatigue and shortness of breath associated with her atrial fibrillation.  She would like a rhythm control strategy.  Today, denies symptoms of palpitations, chest pain, shortness of breath, orthopnea, PND, lower extremity edema, claudication, dizziness, presyncope, syncope, bleeding, or neurologic sequela. The patient is tolerating medications without difficulties. Plan af ablation today.    Past Medical History:  Diagnosis Date   Asymmetric septal hypertrophy (HCC)    Dyspnea on exertion 02/19/11   Transthoracic echocardiogram EF = >55%. Mild proximal septal thickening noted. LV systolic function is normal. Mild to moderate tricuspid regurgitation. RV systolic pressure is elevated at 32 mmHg. Aortic valve is mildly sclerotic. Mild aortic root dilatation.   Hyperlipidemia    Hypertension    Lung disease    mild   LVH (left ventricular hypertrophy)    Past Surgical History:  Procedure Laterality Date   CARDIAC CATHETERIZATION  2002   (From Dr. Kennon Holter office note 05/01/11) Pt. catheterized 10 years ago in [facility] and was told she had "normal coronary arteries"     Current Facility-Administered Medications  Medication Dose Route Frequency Provider Last Rate Last Admin   0.9 %  sodium chloride infusion    Intravenous Continuous Constance Haw, MD 50 mL/hr at 10/02/21 0557 New Bag at 10/02/21 0557    Allergies:   Codeine   Social History:  The patient  reports that she quit smoking about 11 years ago. Her smoking use included cigarettes. She does not have any smokeless tobacco history on file. She reports current alcohol use. She reports that she does not use drugs.   Family History:  The patient's family history includes Breast cancer in her mother; Cancer in her paternal grandmother; Hodgkin's lymphoma in her father.   ROS:  Please see the history of present illness.   Otherwise, review of systems is positive for none.   All other systems are reviewed and negative.   PHYSICAL EXAM: VS:  BP (!) 174/75   Pulse 61   Temp 98 F (36.7 C) (Oral)   Resp 19   Ht 5\' 5"  (1.651 m)   Wt 69.4 kg   SpO2 96%   BMI 25.46 kg/m  , BMI Body mass index is 25.46 kg/m. GEN: Well nourished, well developed, in no acute distress  HEENT: normal  Neck: no JVD, carotid bruits, or masses Cardiac: RRR; no murmurs, rubs, or gallops,no edema  Respiratory:  clear to auscultation bilaterally, normal work of breathing GI: soft, nontender, nondistended, + BS MS: no deformity or atrophy  Skin: warm and dry Neuro:  Strength and sensation are intact Psych: euthymic mood, full affect  Recent Labs: 09/17/2021: BUN 7; Creatinine, Ser 0.84; Hemoglobin 11.4; Platelets 246; Potassium 3.9; Sodium 144    Lipid Panel  No results found for: CHOL, TRIG, HDL, CHOLHDL, VLDL, LDLCALC,  LDLDIRECT   Wt Readings from Last 3 Encounters:  10/02/21 69.4 kg  07/27/21 69.4 kg  04/10/21 69.4 kg      Other studies Reviewed: Additional studies/ records that were reviewed today include: TTE 02/12/2021 Review of the above records today demonstrates:  Ejection fraction 60 to 65% LV size and wall thickness normal Mildly dilated left atrium Right ventricle normal in size and function Right atrium mildly enlarged Aortic valve  structurally normal without stenosis or regurgitation normal-appearing mitral valve with normal valve function  ASSESSMENT AND PLAN:  1.  Paroxysmal atrial fibrillation: Yolanda Smith has presented today for surgery, with the diagnosis of AF.  The various methods of treatment have been discussed with the patient and family. After consideration of risks, benefits and other options for treatment, the patient has consented to  Procedure(s): Catheter ablation as a surgical intervention .  Risks include but not limited to complete heart block, stroke, esophageal damage, nerve damage, bleeding, vascular damage, tamponade, perforation, MI, and death. The patient's history has been reviewed, patient examined, no change in status, stable for surgery.  I have reviewed the patient's chart and labs.  Questions were answered to the patient's satisfaction.    Ja Pistole Curt Bears, MD 10/02/2021 7:03 AM

## 2021-10-02 NOTE — Anesthesia Postprocedure Evaluation (Signed)
Anesthesia Post Note  Patient: Yolanda Smith  Procedure(s) Performed: ATRIAL FIBRILLATION ABLATION     Patient location during evaluation: PACU Anesthesia Type: General Level of consciousness: awake and alert Pain management: pain level controlled Vital Signs Assessment: post-procedure vital signs reviewed and stable Respiratory status: spontaneous breathing, nonlabored ventilation, respiratory function stable and patient connected to nasal cannula oxygen Cardiovascular status: blood pressure returned to baseline and stable Postop Assessment: no apparent nausea or vomiting Anesthetic complications: no   There were no known notable events for this encounter.  Last Vitals:  Vitals:   10/02/21 1045 10/02/21 1100  BP: (!) 146/71 139/70  Pulse: 60 62  Resp: 15 16  Temp:    SpO2: (!) 88% (!) 88%    Last Pain:  Vitals:   10/02/21 1312  TempSrc:   PainSc: 8                  Kinston Magnan P Donyel Castagnola

## 2021-10-02 NOTE — Progress Notes (Signed)
Client states headache decreased to 4/10 and may be because of no caffeine. Client given coffee; up and walked and tolerated well; bilat groins stable, sm amt oozing; dressing changed and no further oozing an no hematoma

## 2021-10-02 NOTE — Progress Notes (Signed)
Dr Curt Bears in and injected left groin with lidocaine and epi and no further oozing noted

## 2021-10-02 NOTE — Discharge Instructions (Signed)
Post procedure care instructions No driving for 4 days. No lifting over 5 lbs for 1 week. No vigorous or sexual activity for 1 week. You may return to work/your usual activities on 5/32/23. Keep procedure site clean & dry. If you notice increased pain, swelling, bleeding or pus, call/return!  You may shower after 24 hours, but no soaking in baths/hot tubs/pools for 1 week.    You have an appointment set up with the [facility].  Multiple studies have shown that being followed by a dedicated atrial fibrillation clinic in addition to the standard care you receive from your other physicians improves health. We believe that enrollment in the atrial fibrillation clinic will allow [place] to better care for you.   The phone number to the [facility] is [phone]. The clinic is staffed Monday through Friday from 8:30am to 5pm.  Parking Directions: The clinic is located in the Heart and Vascular Building connected to [facility]. 1)From [address] turn on to [address] and go to the 3rd entrance  (Heart and Vascular entrance) on the right. 2)Look to the right for Heart &Vascular Parking Garage. 3)A code for the entrance is required, for June is 1004.   4)Take the elevators to the 1st floor. Registration is in the room with the glass walls at the end of the hallway.  If you have any trouble parking or locating the clinic, please don't hesitate to call [phone].

## 2021-10-02 NOTE — Transfer of Care (Signed)
Immediate Anesthesia Transfer of Care Note  Patient: Yolanda Smith  Procedure(s) Performed: ATRIAL FIBRILLATION ABLATION  Patient Location: Cath Lab  Anesthesia Type:General  Level of Consciousness: drowsy  Airway & Oxygen Therapy: Patient Spontanous Breathing and Patient connected to nasal cannula oxygen  Post-op Assessment: Report given to RN and Post -op Vital signs reviewed and stable  Post vital signs: Reviewed and stable  Last Vitals:  Vitals Value Taken Time  BP 162/60 10/02/21 0936  Temp 36.6 C 10/02/21 0935  Pulse 61 10/02/21 0938  Resp 15 10/02/21 0938  SpO2 94 % 10/02/21 0938  Vitals shown include unvalidated device data.  Last Pain:  Vitals:   10/02/21 0935  TempSrc: Temporal  PainSc: Asleep         Complications: There were no known notable events for this encounter.

## 2021-10-02 NOTE — Anesthesia Procedure Notes (Signed)
Procedure Name: Intubation Date/Time: 10/02/2021 7:38 AM Performed by: Kyung Rudd, CRNA Pre-anesthesia Checklist: Patient identified, Emergency Drugs available, Suction available and Patient being monitored Patient Re-evaluated:Patient Re-evaluated prior to induction Oxygen Delivery Method: Circle system utilized Preoxygenation: Pre-oxygenation with 100% oxygen Induction Type: IV induction Ventilation: Mask ventilation without difficulty Laryngoscope Size: Mac and 3 Grade View: Grade I Tube type: Oral Tube size: 7.0 mm Number of attempts: 1 Airway Equipment and Method: Stylet Placement Confirmation: ETT inserted through vocal cords under direct vision, positive ETCO2 and breath sounds checked- equal and bilateral Secured at: 21 cm Tube secured with: Tape Dental Injury: Teeth and Oropharynx as per pre-operative assessment

## 2021-10-03 ENCOUNTER — Encounter (HOSPITAL_COMMUNITY): Payer: Self-pay | Admitting: Cardiology

## 2021-10-31 ENCOUNTER — Ambulatory Visit (HOSPITAL_COMMUNITY)
Admit: 2021-10-31 | Discharge: 2021-10-31 | Disposition: A | Discharge status: Home / Self Care | Payer: 59 | Attending: Nurse Practitioner | Admitting: Nurse Practitioner

## 2021-10-31 ENCOUNTER — Encounter (HOSPITAL_COMMUNITY): Payer: Self-pay | Admitting: Nurse Practitioner

## 2021-10-31 VITALS — BP 138/88 | HR 73 | Ht 65.0 in | Wt 150.4 lb

## 2021-10-31 DIAGNOSIS — I4891 Unspecified atrial fibrillation: Secondary | ICD-10-CM | POA: Diagnosis present

## 2021-10-31 DIAGNOSIS — Z7901 Long term (current) use of anticoagulants: Secondary | ICD-10-CM | POA: Diagnosis not present

## 2021-10-31 DIAGNOSIS — J449 Chronic obstructive pulmonary disease, unspecified: Secondary | ICD-10-CM | POA: Diagnosis not present

## 2021-10-31 DIAGNOSIS — Z79899 Other long term (current) drug therapy: Secondary | ICD-10-CM | POA: Diagnosis not present

## 2021-10-31 DIAGNOSIS — D6869 Other thrombophilia: Secondary | ICD-10-CM | POA: Diagnosis not present

## 2021-10-31 DIAGNOSIS — I10 Essential (primary) hypertension: Secondary | ICD-10-CM | POA: Insufficient documentation

## 2021-10-31 NOTE — Progress Notes (Signed)
Primary Care Physician: [facility] Referring Physician:Camnitz     Yolanda Smith is a 76 y.o. female with a h/o afib that is one month s/p afib ablation. EKG reveals SR with a short PR interval. She continues on eliquis 5 mg bid for a CHA2DS2VASc  score of 4. Denies any swallowing or groin issues. Has not noted any afib since the procedure. Has some baseline shortness of breath at her baseline which she contributes to COPD.  Today, she denies symptoms of palpitations, chest pain, shortness of breath, orthopnea, PND, lower extremity edema, dizziness, presyncope, syncope, or neurologic sequela. The patient is tolerating medications without difficulties and is otherwise without complaint today.   Past Medical History:  Diagnosis Date   Asymmetric septal hypertrophy (HCC)    Dyspnea on exertion 02/19/11   Transthoracic echocardiogram EF = >55%. Mild proximal septal thickening noted. LV systolic function is normal. Mild to moderate tricuspid regurgitation. RV systolic pressure is elevated at 32 mmHg. Aortic valve is mildly sclerotic. Mild aortic root dilatation.   Hyperlipidemia    Hypertension    Lung disease    mild   LVH (left ventricular hypertrophy)    Past Surgical History:  Procedure Laterality Date   ATRIAL FIBRILLATION ABLATION N/A 10/02/2021   Procedure: ATRIAL FIBRILLATION ABLATION;  Surgeon: Regan Lemming, MD;  Location: [facility] INV[facility]IVE CV LAB;  Service: Cardiovascular;  Laterality: N/A;   CARDIAC CATHETERIZATION  2002   (From Dr. Hazle Coca office note 05/01/11) Pt. catheterized 10 years ago in [facility] and was told she had "normal coronary arteries"    Current Outpatient Medications  Medication Sig Dispense Refill   acetaminophen (TYLENOL) 500 MG tablet Take 500 mg by mouth every 6 (six) hours as needed for moderate pain.     apixaban (ELIQUIS) 5 MG TABS tablet Take 1 tablet by mouth 2 (two) times daily.     citalopram (CELEXA) 40 MG tablet Take 40 mg by mouth  daily.     clobetasol cream (TEMOVATE) 0.05 % Apply 1 application. topically daily as needed (irritation).     losartan (COZAAR) 50 MG tablet Take 50 mg by mouth daily.     metoprolol tartrate (LOPRESSOR) 100 MG tablet Take 1 tablet (100 mg total) 2 hours prior to CT scan (Patient not taking: Reported on 09/26/2021) 1 tablet 0   nicotine polacrilex (COMMIT) 2 MG lozenge Place 2 mg inside cheek as needed for smoking cessation.     Polyethylene Glycol 400 (BLINK TEARS OP) Place 1 drop into both eyes daily as needed (dry eyes).     pravastatin (PRAVACHOL) 20 MG tablet Take 20 mg by mouth at bedtime.     traMADol-acetaminophen (ULTRACET) 37.5-325 MG tablet Take 1 tablet by mouth 3 (three) times daily as needed for moderate pain.     traZODone (DESYREL) 150 MG tablet Take 150 mg by mouth at bedtime.     No current facility-administered medications for this encounter.    Allergies  Allergen Reactions   Codeine     Upset stomach     Social History   Socioeconomic History   Marital status: Married    Spouse name: Not on file   Number of children: 2   Years of education: Not on file   Highest education level: Not on file  Occupational History   Occupation: Office  Tobacco Use   Smoking status: Former    Years: 50.00    Types: Cigarettes    Quit date: 08/31/2010  Years since quitting: 11.1   Smokeless tobacco: Not on file  Substance and Sexual Activity   Alcohol use: Yes    Comment: 6-10 drinks (beer or wine) a year   Drug use: No   Sexual activity: Not on file  Other Topics Concern   Not on file  Social History Narrative   Not on file   Social Determinants of Health   Financial Resource Strain: Not on file  Food Insecurity: Not on file  Transportation Needs: Not on file  Physical Activity: Not on file  Stress: Not on file  Social Connections: Not on file  Intimate Partner Violence: Not on file    Family History  Problem Relation Age of Onset   Breast cancer Mother     Hodgkin's lymphoma Father    Cancer Paternal Grandmother     ROS- All systems are reviewed and negative except as per the HPI above  Physical Exam: There were no vitals filed for this visit. Wt Readings from Last 3 Encounters:  10/02/21 69.4 kg  07/27/21 69.4 kg  04/10/21 69.4 kg    Labs: Lab Results  Component Value Date   NA 144 09/17/2021   K 3.9 09/17/2021   CL 108 (H) 09/17/2021   CO2 29 09/17/2021   GLUCOSE 96 09/17/2021   BUN 7 (L) 09/17/2021   CREATININE 0.84 09/17/2021   CALCIUM 9.2 09/17/2021   No results found for: "INR" No results found for: "CHOL", "HDL", "LDLCALC", "TRIG"   GEN- The patient is well appearing, alert and oriented x 3 today.   Head- normocephalic, atraumatic Eyes-  Sclera clear, conjunctiva pink Ears- hearing intact Oropharynx- clear Neck- supple, no JVP Lymph- no cervical lymphadenopathy Lungs- Clear to ausculation bilaterally, normal work of breathing Heart- Regular rate and rhythm, no murmurs, rubs or gallops, PMI not laterally displaced GI- soft, NT, ND, + BS Extremities- no clubbing, cyanosis, or edema MS- no significant deformity or atrophy Skin- no rash or lesion Psych- euthymic mood, full affect Neuro- strength and sensation are intact  EKG-Vent. rate 73 BPM PR interval 108 ms QRS duration 76 ms QT/QTcB 414/456 ms P-R-T axes * 37 -14 Sinus rhythm with short PR Septal infarct , age undetermined Abnormal ECG When compared with ECG of 02-Oct-2021 09:42, PREVIOUS ECG IS PRESENT    Assessment and Plan:  1. Afib S/p ablation 10/02/21 She is doing well maintaining   SR Back to her  usual activities    2. CHA2DS2VASc  of at least 4 Continue eliquis 5 mg bid without interruption   3. HTN Stable   F/u with Dr. Elberta Fortis as scheduled 02/04/22  [name]. [facility] [address] [place], [place] [address] [phone]

## 2021-12-10 ENCOUNTER — Telehealth: Payer: Self-pay | Admitting: Cardiology

## 2021-12-10 NOTE — Telephone Encounter (Signed)
CT result reviewed with pt. Patient verbalized understanding and agreeable to plan.

## 2021-12-10 NOTE — Telephone Encounter (Signed)
Follow Up:    Patient said she received a message on her My Chart to call the office about her CT results.. If you do not reach her at work, please call her on [phone].

## 2022-02-04 ENCOUNTER — Encounter: Payer: Self-pay | Admitting: Cardiology

## 2022-02-04 ENCOUNTER — Ambulatory Visit: Payer: 59 | Attending: Cardiology | Admitting: Cardiology

## 2022-02-04 VITALS — BP 140/88 | HR 76 | Ht 65.0 in | Wt 151.1 lb

## 2022-02-04 DIAGNOSIS — D6869 Other thrombophilia: Secondary | ICD-10-CM

## 2022-02-04 DIAGNOSIS — I480 Paroxysmal atrial fibrillation: Secondary | ICD-10-CM

## 2022-02-04 DIAGNOSIS — I10 Essential (primary) hypertension: Secondary | ICD-10-CM | POA: Diagnosis not present

## 2022-02-04 NOTE — Patient Instructions (Signed)
Medication Instructions:  Your physician recommends that you continue on your current medications as directed. Please refer to the Current Medication list given to you today.  *If you need a refill on your cardiac medications before your next appointment, please call your pharmacy*   Lab Work: None ordered   Testing/Procedures: None ordered   Follow-Up: At [facility], you and your health needs are our priority.  As part of our continuing mission to provide you with exceptional heart care, we have created designated Provider Care Teams.  These Care Teams include your primary Cardiologist (physician) and Advanced Practice Providers (APPs -  Physician Assistants and Nurse Practitioners) who all work together to provide you with the care you need, when you need it.  Your next appointment:   6 month(s)  The format for your next appointment:   In Person  Provider:   Will Camnitz, MD    Thank you for choosing [facility]!!   Sunjai Levandoski, RN [phone]  Other Instructions   Important Information About Sugar           

## 2022-02-04 NOTE — Progress Notes (Signed)
Electrophysiology Office Note   Date:  02/04/2022   ID:  Alyona Romack, DOB [birth year], MRN [id number]  PCP:  [facility]  Cardiologist:  Lennice Sites Primary Electrophysiologist:  Donice Alperin Meredith Leeds, MD    Chief Complaint: AF   History of Present Illness: Yolanda Smith is a 76 y.o. female who is being seen today for the evaluation of AF at the request of [facility]. Presenting today for electrophysiology evaluation.  She has a history seen for hypertension, hyperlipidemia, LVH with asymmetric septal hypertrophy.  She also has a history of atrial fibrillation.  She was initially on verapamil but was stopped due to junctional bradycardia.  She has fatigue and shortness of breath associated with her atrial fibrillation.  She is post ablation 10/02/2021.  Today, denies symptoms of palpitations, chest pain, shortness of breath, orthopnea, PND, lower extremity edema, claudication, dizziness, presyncope, syncope, bleeding, or neurologic sequela. The patient is tolerating medications without difficulties.  Since her ablation she has done well.  She has had no chest pain or shortness of breath.  She has been able to do all of her daily activities.  She has noted intermittent palpitations that last for few seconds, but nothing has been prolonged.   Past Medical History:  Diagnosis Date   Asymmetric septal hypertrophy (HCC)    Dyspnea on exertion 02/19/11   Transthoracic echocardiogram EF = >55%. Mild proximal septal thickening noted. LV systolic function is normal. Mild to moderate tricuspid regurgitation. RV systolic pressure is elevated at 32 mmHg. Aortic valve is mildly sclerotic. Mild aortic root dilatation.   Hyperlipidemia    Hypertension    Lung disease    mild   LVH (left ventricular hypertrophy)    Past Surgical History:  Procedure Laterality Date   ATRIAL FIBRILLATION ABLATION N/A 10/02/2021   Procedure: ATRIAL FIBRILLATION ABLATION;  Surgeon: Constance Haw,  MD;  Location: [place] CV LAB;  Service: Cardiovascular;  Laterality: N/A;   CARDIAC CATHETERIZATION  2002   (From Dr. Kennon Holter office note 05/01/11) Pt. catheterized 10 years ago in [facility] and was told she had "normal coronary arteries"     Current Outpatient Medications  Medication Sig Dispense Refill   acetaminophen (TYLENOL) 500 MG tablet Take 500 mg by mouth every 6 (six) hours as needed for moderate pain.     apixaban (ELIQUIS) 5 MG TABS tablet Take 1 tablet by mouth 2 (two) times daily.     citalopram (CELEXA) 40 MG tablet Take 40 mg by mouth daily.     clobetasol cream (TEMOVATE) 8.24 % Apply 1 application. topically daily as needed (irritation).     losartan (COZAAR) 50 MG tablet Take 50 mg by mouth daily.     nicotine polacrilex (COMMIT) 2 MG lozenge Place 2 mg inside cheek as needed for smoking cessation.     Polyethylene Glycol 400 (BLINK TEARS OP) Place 1 drop into both eyes daily as needed (dry eyes).     pravastatin (PRAVACHOL) 20 MG tablet Take 20 mg by mouth at bedtime.     traMADol-acetaminophen (ULTRACET) 37.5-325 MG tablet Take 1 tablet by mouth 3 (three) times daily as needed for moderate pain.     traZODone (DESYREL) 150 MG tablet Take 150 mg by mouth at bedtime.     No current facility-administered medications for this visit.    Allergies:   Codeine   Social History:  The patient  reports that she quit smoking about 11 years ago. Her smoking use included cigarettes. She  does not have any smokeless tobacco history on file. She reports current alcohol use. She reports that she does not use drugs.   Family History:  The patient's family history includes Breast cancer in her mother; Cancer in her paternal grandmother; Hodgkin's lymphoma in her father.   ROS:  Please see the history of present illness.   Otherwise, review of systems is positive for none.   All other systems are reviewed and negative.   PHYSICAL EXAM: VS:  BP (!) 140/88   Pulse 76   Ht 5\' 5"   (1.651 m)   Wt 151 lb 1.9 oz (68.5 kg)   SpO2 93%   BMI 25.15 kg/m  , BMI Body mass index is 25.15 kg/m. GEN: Well nourished, well developed, in no acute distress  HEENT: normal  Neck: no JVD, carotid bruits, or masses Cardiac: RRR; no murmurs, rubs, or gallops,no edema  Respiratory:  clear to auscultation bilaterally, normal work of breathing GI: soft, nontender, nondistended, + BS MS: no deformity or atrophy  Skin: warm and dry Neuro:  Strength and sensation are intact Psych: euthymic mood, full affect  EKG:  EKG is ordered today. Personal review of the ekg ordered shows ectopic atrial rhythm, rate 76  Recent Labs: 09/17/2021: BUN 7; Creatinine, Ser 0.84; Hemoglobin 11.4; Platelets 246; Potassium 3.9; Sodium 144    Lipid Panel  No results found for: "CHOL", "TRIG", "HDL", "CHOLHDL", "VLDL", "LDLCALC", "LDLDIRECT"   Wt Readings from Last 3 Encounters:  02/04/22 151 lb 1.9 oz (68.5 kg)  10/31/21 150 lb 6.4 oz (68.2 kg)  10/02/21 153 lb (69.4 kg)      Other studies Reviewed: Additional studies/ records that were reviewed today include: TTE 02/12/2021 Review of the above records today demonstrates:  Ejection fraction 60 to 65% LV size and wall thickness normal Mildly dilated left atrium Right ventricle normal in size and function Right atrium mildly enlarged Aortic valve structurally normal without stenosis or regurgitation normal-appearing mitral valve with normal valve function  ASSESSMENT AND PLAN:  1.  Paroxysmal atrial fibrillation: Currently on Eliquis 5 mg twice daily.  CHA2DS2-VASc of 4.  Status post ablation 10/02/2021.  She is remained in sinus rhythm since her ablation.  Overall feeling well.  No changes.  2.  Hypertension: Currently well controlled  3.  Secondary hypercoagulable state: Currently on Eliquis for atrial fibrillation as above.  Current medicines are reviewed at length with the patient today.   The patient does not have concerns regarding her  medicines.  The following changes were made today: None  Labs/ tests ordered today include:  Orders Placed This Encounter  Procedures   EKG 12-Lead      Disposition:   FU with Florentino Laabs 6 months  Signed, Tangi Shroff 10/04/2021, MD  02/04/2022 2:43 PM     [facility] [address] [place] [phone] (office) [phone] (fax)

## 2022-06-20 ENCOUNTER — Encounter (HOSPITAL_COMMUNITY): Payer: Self-pay | Admitting: *Deleted

## 2022-11-25 ENCOUNTER — Ambulatory Visit: Payer: 59 | Attending: Cardiology | Admitting: Cardiology

## 2022-11-25 ENCOUNTER — Encounter: Payer: Self-pay | Admitting: Cardiology

## 2022-11-25 VITALS — BP 136/86 | HR 70 | Ht 65.0 in | Wt 149.0 lb

## 2022-11-25 DIAGNOSIS — D6869 Other thrombophilia: Secondary | ICD-10-CM

## 2022-11-25 DIAGNOSIS — I10 Essential (primary) hypertension: Secondary | ICD-10-CM

## 2022-11-25 DIAGNOSIS — I480 Paroxysmal atrial fibrillation: Secondary | ICD-10-CM

## 2022-11-25 NOTE — Progress Notes (Signed)
Electrophysiology Office Note:   Date:  11/25/2022  ID:  Yolanda Smith, DOB Sep 22, 1945, MRN [id number]  Primary Cardiologist: None Electrophysiologist: Shion Bluestein Jorja Loa, MD      History of Present Illness:   Yolanda Smith is a 77 y.o. female with h/o atrial fibrillation seen today for routine electrophysiology followup.  Since last being seen in our clinic the patient reports doing well.  She has had no further episodes of atrial fibrillation since her ablation.  She does get somewhat dizzy when she stands up too fast, but otherwise has no acute complaints.  She did have an episode of near syncope while driving that lasted a second or 2.  She states that she had not taken her medications or eaten anything that day.  This is her first episode and she has not had any recurrence.  she denies chest pain, palpitations, dyspnea, PND, orthopnea, nausea, vomiting, dizziness, syncope, edema, weight gain, or early satiety.     She has a history of atrial fibrillation.  She was initially started on verapamil but this was stopped due to junctional bradycardia.  She has shortness of breath and fatigue associated with her atrial fibrillation.  She is post ablation 10/02/2021.     Review of systems complete and found to be negative unless listed in HPI.   EP Information / Studies Reviewed:    EKG is ordered today. Personal review as below.  EKG Interpretation Date/Time:  Monday November 25 2022 16:07:50 EDT Ventricular Rate:  70 PR Interval:  114 QRS Duration:  76 QT Interval:  446 QTC Calculation: 481 R Axis:   145  Text Interpretation: Normal sinus rhythm Right axis deviation Nonspecific ST and T wave abnormality Prolonged QT When compared with ECG of 31-Oct-2021 10:47, Nonspecific T wave abnormality, worse in Lateral leads Confirmed by Shenna Brissette ([id number]) on 11/25/2022 4:12:23 PM    Risk Assessment/Calculations:    CHA2DS2-VASc Score = 4   This indicates a 4.8% annual risk of stroke. The patient's  score is based upon: CHF History: 0 HTN History: 1 Diabetes History: 0 Stroke History: 0 Vascular Disease History: 0 Age Score: 2 Gender Score: 1             Physical Exam:   VS:  BP 136/86   Pulse 70   Ht 5\' 5"  (1.651 m)   Wt 149 lb (67.6 kg)   SpO2 95%   BMI 24.79 kg/m    Wt Readings from Last 3 Encounters:  11/25/22 149 lb (67.6 kg)  02/04/22 151 lb 1.9 oz (68.5 kg)  10/31/21 150 lb 6.4 oz (68.2 kg)     GEN: Well nourished, well developed in no acute distress NECK: No JVD; No carotid bruits CARDIAC: Regular rate and rhythm, no murmurs, rubs, gallops RESPIRATORY:  Clear to auscultation without rales, wheezing or rhonchi  ABDOMEN: Soft, non-tender, non-distended EXTREMITIES:  No edema; No deformity   ASSESSMENT AND PLAN:    1.  Paroxysmal atrial fibrillation: Currently on Eliquis.  Status post ablation 10/02/2021.  She has remained in sinus rhythm.  Ihor Meinzer continue with current management.  2.  Hypertension: Currently well-controlled  3.  Secondary hypercoagulable state: Currently on Eliquis for atrial fibrillation  4.  Near syncope: Unclear as to the cause.  She had not had any of her medications that day and had not eaten.  She Shanquita Ronning call us back if she has any recurrent episodes.  Follow up with Dr. Elberta Fortis in 12 months  Signed, Wayde Gopaul Jorja Loa, MD

## 2023-11-27 LAB — LAB REPORT - SCANNED: EGFR (Non-African Amer.): 45

## 2024-01-05 ENCOUNTER — Encounter: Payer: Self-pay | Admitting: Cardiology

## 2024-01-05 ENCOUNTER — Ambulatory Visit: Attending: Cardiology | Admitting: Cardiology

## 2024-01-05 VITALS — BP 137/88 | HR 69 | Ht 64.5 in | Wt 150.1 lb

## 2024-01-05 DIAGNOSIS — I480 Paroxysmal atrial fibrillation: Secondary | ICD-10-CM

## 2024-01-05 DIAGNOSIS — I10 Essential (primary) hypertension: Secondary | ICD-10-CM

## 2024-01-05 DIAGNOSIS — D6869 Other thrombophilia: Secondary | ICD-10-CM

## 2024-01-05 NOTE — Progress Notes (Signed)
  Electrophysiology Office Note:   Date:  01/05/2024  ID:  Yolanda Smith, DOB 02/05/1946, MRN [id number]  Primary Cardiologist: None Primary Heart Failure: None Electrophysiologist: Nimco Bivens Gladis Norton, MD      History of Present Illness:   Yolanda Smith is a 78 y.o. female with h/o atrial fibrillation, hypertension seen today for routine electrophysiology followup.   Since last being seen in our clinic the patient reports doing overall well.  She has noted no further episodes of atrial fibrillation.  She has been somewhat dizzy, but she thinks that this is related to an inner ear issue.  She has tried specific maneuvers which has somewhat improved her symptoms.  She has plans to follow-up with her primary physician for this.  Aside from that, she has no acute complaints.  she denies chest pain, palpitations, dyspnea, PND, orthopnea, nausea, vomiting, dizziness, syncope, edema, weight gain, or early satiety.   Review of systems complete and found to be negative unless listed in HPI.   EP Information / Studies Reviewed:    EKG is ordered today. Personal review as below.  EKG Interpretation Date/Time:  Monday January 05 2024 14:23:03 EDT Ventricular Rate:  69 PR Interval:  102 QRS Duration:  70 QT Interval:  434 QTC Calculation: 465 R Axis:   35  Text Interpretation: Sinus rhythm with short PR Septal infarct , age undetermined When compared with ECG of 25-Nov-2022 16:07, QRS axis Shifted left Confirmed by Rogerio Boutelle ([id number]) on 01/05/2024 2:37:07 PM    Risk Assessment/Calculations:    CHA2DS2-VASc Score = 4   This indicates a 4.8% annual risk of stroke. The patient's score is based upon: CHF History: 0 HTN History: 1 Diabetes History: 0 Stroke History: 0 Vascular Disease History: 0 Age Score: 2 Gender Score: 1            Physical Exam:   VS:  BP 137/88   Pulse 69   Ht 5' 4.5 (1.638 m)   Wt 150 lb 1.9 oz (68.1 kg)   SpO2 96%   BMI 25.37 kg/m    Wt Readings from Last 3  Encounters:  01/05/24 150 lb 1.9 oz (68.1 kg)  11/25/22 149 lb (67.6 kg)  02/04/22 151 lb 1.9 oz (68.5 kg)     GEN: Well nourished, well developed in no acute distress NECK: No JVD; No carotid bruits CARDIAC: Regular rate and rhythm, no murmurs, rubs, gallops RESPIRATORY:  Clear to auscultation without rales, wheezing or rhonchi  ABDOMEN: Soft, non-tender, non-distended EXTREMITIES:  No edema; No deformity   ASSESSMENT AND PLAN:    1.  Paroxysmal atrial fibrillation: Post ablation 10/02/2021.  Remains in sinus rhythm.  She has had no further episodes of atrial fibrillation.  Feeling well and happy with her control.  2.  Secondary hypercoagulable state: On Eliquis   3.  Hypertension: Well-controlled  Follow up with Dr. Norton in 12 months  Signed, Jeziel Hoffmann Gladis Norton, MD

## 2024-03-30 ENCOUNTER — Telehealth: Payer: Self-pay

## 2024-03-30 NOTE — Telephone Encounter (Signed)
   Pre-operative Risk Assessment    Patient Name: Yolanda Smith  DOB: 06-24-1945 MRN: [id number]   Date of last office visit: 01/05/24 Date of next office visit:   Request for Surgical Clearance    Procedure:  Dental Extraction - Amount of Teeth to be Pulled:  1 simple extraction   Date of Surgery:  Clearance 04/14/24                                 Surgeon:  Dr. Rolan Snide  Surgeon's Group or Practice Name:  [facility]  Phone number:  [phone] Fax number:  [phone]   Type of Clearance Requested:   - Medical  - Pharmacy:  Hold Apixaban  (Eliquis ) Not indicated    Type of Anesthesia:  Local localized numbing    Additional requests/questions:    [name]   03/30/2024, 4:41 PM

## 2024-03-30 NOTE — Telephone Encounter (Signed)
   Patient Name: Yolanda Smith  DOB: January 25, 1946 MRN: [id number]  Primary Cardiologist: None  Chart reviewed as part of pre-operative protocol coverage.   Dental extractions (i.e. 1-2 teeth) are considered low risk procedures per guidelines and generally do not require any specific cardiac clearance. It is also generally accepted that for 2 or less extractions and dental cleanings, there is no need to interrupt blood thinner therapy.  SBE prophylaxis is not required for the patient from a cardiac standpoint.  I will route this recommendation to the requesting party via Epic fax function and remove from pre-op  pool.  Please call with questions.  Jon Garre Ciani Rutten, PA 03/30/2024, 4:48 PM
